# Patient Record
Sex: Female | Born: 1970 | Race: White | Hispanic: No | Marital: Married | Smoking: Current some day smoker
Health system: Southern US, Community
[De-identification: ages and names within clinical notes are randomized; demographics above are authoritative.]

## PROBLEM LIST (undated history)

## (undated) DIAGNOSIS — I10 Essential (primary) hypertension: Secondary | ICD-10-CM

## (undated) DIAGNOSIS — F329 Major depressive disorder, single episode, unspecified: Secondary | ICD-10-CM

## (undated) DIAGNOSIS — R519 Headache, unspecified: Secondary | ICD-10-CM

## (undated) DIAGNOSIS — K219 Gastro-esophageal reflux disease without esophagitis: Secondary | ICD-10-CM

## (undated) DIAGNOSIS — N200 Calculus of kidney: Secondary | ICD-10-CM

## (undated) DIAGNOSIS — J45909 Unspecified asthma, uncomplicated: Secondary | ICD-10-CM

## (undated) DIAGNOSIS — N390 Urinary tract infection, site not specified: Secondary | ICD-10-CM

## (undated) DIAGNOSIS — R51 Headache: Secondary | ICD-10-CM

## (undated) DIAGNOSIS — G43909 Migraine, unspecified, not intractable, without status migrainosus: Secondary | ICD-10-CM

## (undated) DIAGNOSIS — Z87442 Personal history of urinary calculi: Secondary | ICD-10-CM

## (undated) DIAGNOSIS — F32A Depression, unspecified: Secondary | ICD-10-CM

## (undated) DIAGNOSIS — B019 Varicella without complication: Secondary | ICD-10-CM

## (undated) HISTORY — DX: Migraine, unspecified, not intractable, without status migrainosus: G43.909

## (undated) HISTORY — PX: LAPAROSCOPIC ENDOMETRIOSIS FULGURATION: SUR769

## (undated) HISTORY — DX: Depression, unspecified: F32.A

## (undated) HISTORY — DX: Headache, unspecified: R51.9

## (undated) HISTORY — DX: Calculus of kidney: N20.0

## (undated) HISTORY — DX: Major depressive disorder, single episode, unspecified: F32.9

## (undated) HISTORY — DX: Headache: R51

## (undated) HISTORY — PX: TONSILLECTOMY AND ADENOIDECTOMY: SUR1326

## (undated) HISTORY — DX: Varicella without complication: B01.9

## (undated) HISTORY — DX: Urinary tract infection, site not specified: N39.0

## (undated) HISTORY — DX: Essential (primary) hypertension: I10

---

## 2003-09-17 ENCOUNTER — Other Ambulatory Visit: Payer: Self-pay

## 2004-10-04 ENCOUNTER — Ambulatory Visit: Payer: Self-pay | Admitting: Family Medicine

## 2004-10-15 ENCOUNTER — Ambulatory Visit: Payer: Self-pay | Admitting: Unknown Physician Specialty

## 2004-12-03 ENCOUNTER — Ambulatory Visit: Payer: Self-pay | Admitting: Family Medicine

## 2005-03-08 ENCOUNTER — Ambulatory Visit: Payer: Self-pay | Admitting: Obstetrics and Gynecology

## 2005-04-26 ENCOUNTER — Emergency Department: Payer: Self-pay | Admitting: Emergency Medicine

## 2005-05-07 ENCOUNTER — Ambulatory Visit: Payer: Self-pay | Admitting: Gastroenterology

## 2005-05-31 ENCOUNTER — Encounter (INDEPENDENT_AMBULATORY_CARE_PROVIDER_SITE_OTHER): Payer: Self-pay | Admitting: Specialist

## 2005-05-31 ENCOUNTER — Ambulatory Visit (HOSPITAL_BASED_OUTPATIENT_CLINIC_OR_DEPARTMENT_OTHER): Admission: RE | Admit: 2005-05-31 | Discharge: 2005-05-31 | Payer: Self-pay | Admitting: Urology

## 2005-07-21 ENCOUNTER — Emergency Department: Payer: Self-pay | Admitting: Emergency Medicine

## 2007-05-31 ENCOUNTER — Emergency Department: Payer: Self-pay | Admitting: Emergency Medicine

## 2007-11-12 ENCOUNTER — Emergency Department: Payer: Self-pay

## 2008-03-04 LAB — HM COLONOSCOPY: HM Colonoscopy: NORMAL

## 2009-01-05 ENCOUNTER — Emergency Department (HOSPITAL_COMMUNITY): Admission: EM | Admit: 2009-01-05 | Discharge: 2009-01-06 | Payer: Self-pay | Admitting: Emergency Medicine

## 2010-03-04 HISTORY — PX: ABDOMINAL HYSTERECTOMY: SHX81

## 2010-03-25 ENCOUNTER — Encounter: Payer: Self-pay | Admitting: Otolaryngology

## 2010-06-06 LAB — DIFFERENTIAL
Basophils Absolute: 0 10*3/uL (ref 0.0–0.1)
Basophils Relative: 0 % (ref 0–1)
Eosinophils Absolute: 0.1 10*3/uL (ref 0.0–0.7)
Eosinophils Relative: 1 % (ref 0–5)
Lymphocytes Relative: 34 % (ref 12–46)
Lymphs Abs: 5.1 10*3/uL — ABNORMAL HIGH (ref 0.7–4.0)
Monocytes Absolute: 0.8 10*3/uL (ref 0.1–1.0)
Monocytes Relative: 6 % (ref 3–12)
Neutro Abs: 8.7 10*3/uL — ABNORMAL HIGH (ref 1.7–7.7)
Neutrophils Relative %: 59 % (ref 43–77)

## 2010-06-06 LAB — URINALYSIS, ROUTINE W REFLEX MICROSCOPIC
Bilirubin Urine: NEGATIVE
Glucose, UA: NEGATIVE mg/dL
Ketones, ur: NEGATIVE mg/dL
Leukocytes, UA: NEGATIVE
Nitrite: NEGATIVE
Protein, ur: 30 mg/dL — AB
Specific Gravity, Urine: 1.025 (ref 1.005–1.030)
Urobilinogen, UA: 0.2 mg/dL (ref 0.0–1.0)
pH: 6 (ref 5.0–8.0)

## 2010-06-06 LAB — WET PREP, GENITAL
Trich, Wet Prep: NONE SEEN
WBC, Wet Prep HPF POC: NONE SEEN
Yeast Wet Prep HPF POC: NONE SEEN

## 2010-06-06 LAB — CBC
HCT: 42.8 % (ref 36.0–46.0)
Hemoglobin: 14.3 g/dL (ref 12.0–15.0)
MCHC: 33.5 g/dL (ref 30.0–36.0)
MCV: 89.6 fL (ref 78.0–100.0)
Platelets: 383 10*3/uL (ref 150–400)
RBC: 4.77 MIL/uL (ref 3.87–5.11)
RDW: 13.8 % (ref 11.5–15.5)
WBC: 14.8 10*3/uL — ABNORMAL HIGH (ref 4.0–10.5)

## 2010-06-06 LAB — URINE CULTURE

## 2010-06-06 LAB — URINE MICROSCOPIC-ADD ON

## 2010-06-06 LAB — POCT PREGNANCY, URINE: Preg Test, Ur: NEGATIVE

## 2010-06-06 LAB — POCT I-STAT, CHEM 8
BUN: 16 mg/dL (ref 6–23)
Calcium, Ion: 1.16 mmol/L (ref 1.12–1.32)
Chloride: 102 mEq/L (ref 96–112)
Creatinine, Ser: 0.8 mg/dL (ref 0.4–1.2)
Glucose, Bld: 75 mg/dL (ref 70–99)
HCT: 46 % (ref 36.0–46.0)
Hemoglobin: 15.6 g/dL — ABNORMAL HIGH (ref 12.0–15.0)
Potassium: 3.2 mEq/L — ABNORMAL LOW (ref 3.5–5.1)
Sodium: 139 mEq/L (ref 135–145)
TCO2: 26 mmol/L (ref 0–100)

## 2010-06-06 LAB — GC/CHLAMYDIA PROBE AMP, GENITAL
Chlamydia, DNA Probe: NEGATIVE
GC Probe Amp, Genital: NEGATIVE

## 2010-07-20 NOTE — Op Note (Signed)
Crystal Leonard, Crystal Leonard              ACCOUNT NO.:  [id number]   MEDICAL RECORD NO.:  [id number]          PATIENT TYPE:  AMB   LOCATION:  [facility]                         FACILITY:  [facility]   PHYSICIAN:  Ronald L. Earlene Plater, M.D.  DATE OF BIRTH:  31-Oct-1970   DATE OF PROCEDURE:  05/31/2005  DATE OF DISCHARGE:  05/31/2005                                 OPERATIVE REPORT   DIAGNOSIS:  Pelvic pain, questionable interstitial cystitis.   OPERATIVE PROCEDURE:  Cystourethroscopy, hydraulic bladder distention,  bladder biopsy.   SURGEON:  Lucrezia Starch. Earlene Plater, M.D.   ANESTHESIA:  LMA.   ESTIMATED BLOOD LOSS:  Negligible.   TUBES:  None.   COMPLICATIONS:  None.   INDICATIONS FOR PROCEDURE:  [name] is a lovely 40 year old white female  who essentially presents with chronic pelvic pain since October 2006.  She  notes that she has had severe endometriosis and noted that she underwent  laparoscopic evaluation in January 2007 due to the pelvic pain that began in  October 2006.  Endometrioma was revealed.  She was put on Depo-Provera and  subsequently put on Lupron, but she has continued to have pain on a 5 to  6/10 scale.  It can be as severe as 10.  She has been to the emergency room  several times in the last few months.  She has had multiple urine tests and  essentially comes in for evaluation.  She notes that the Pyridium has  improved her symptoms some, but she still has dysuria, frequency, some  hesitancy and basically pelvic pain.  After understanding the risks,  benefits and alternatives, she would like to proceed with the above  procedure.   PROCEDURE IN DETAIL:  The patient was placed in the supine position.  After  proper LMA anesthesia, she was placed in the dorsal lithotomy position,  prepped and draped with Betadine in sterile fashion.  Cystourethroscopy was  performed with a 22.5 French Olympus panendoscope, and the bladder was  carefully distended to 80 cm of water pressure.  She was  noted to have a  bladder capacity of only 450 cc, and when it was removed there were a few  but diffuse tiny glomerulations.  No cracks were noted and the  glomerulations were not terribly severe.  Biopsy was taken of the posterior  midline bladder and submitted to pathology.  The base was cauterized with  Bovie coagulation cautery.  The bladder was drained.  The panendoscope was  removed, and the patient was taken to the recovery room stable.     Ronald L. Earlene Plater, M.D.  Electronically Signed    RLD/MEDQ  D:  05/31/2005  T:  06/03/2005  Job:  [id number]

## 2010-09-14 ENCOUNTER — Emergency Department: Payer: Self-pay | Admitting: Emergency Medicine

## 2011-03-05 LAB — HM PAP SMEAR: HM Pap smear: NORMAL

## 2013-05-05 LAB — BASIC METABOLIC PANEL: BUN: 15 mg/dL (ref 4–21)

## 2013-05-27 LAB — HEPATIC FUNCTION PANEL
ALT: 14 U/L (ref 7–35)
AST: 14 U/L (ref 13–35)

## 2013-05-27 LAB — BASIC METABOLIC PANEL
Creatinine: 1 mg/dL (ref <=1.1)
Glucose: 92 mg/dL
Potassium: 4.4 mmol/L (ref 3.4–5.3)
Sodium: 141 mmol/L (ref 137–147)

## 2013-05-27 LAB — TSH: TSH: 1.84 u[IU]/mL (ref <=5.90)

## 2013-06-07 ENCOUNTER — Ambulatory Visit: Payer: Self-pay | Admitting: Adult Health

## 2014-05-03 ENCOUNTER — Encounter (INDEPENDENT_AMBULATORY_CARE_PROVIDER_SITE_OTHER): Payer: Self-pay

## 2014-05-03 ENCOUNTER — Ambulatory Visit (INDEPENDENT_AMBULATORY_CARE_PROVIDER_SITE_OTHER): Payer: No Typology Code available for payment source | Admitting: Nurse Practitioner

## 2014-05-03 ENCOUNTER — Encounter: Payer: Self-pay | Admitting: Nurse Practitioner

## 2014-05-03 VITALS — BP 118/76 | HR 73 | Temp 98.0°F | Resp 12 | Ht 65.0 in | Wt 258.1 lb

## 2014-05-03 DIAGNOSIS — F329 Major depressive disorder, single episode, unspecified: Secondary | ICD-10-CM | POA: Diagnosis not present

## 2014-05-03 DIAGNOSIS — F32A Depression, unspecified: Secondary | ICD-10-CM

## 2014-05-03 DIAGNOSIS — G43809 Other migraine, not intractable, without status migrainosus: Secondary | ICD-10-CM

## 2014-05-03 DIAGNOSIS — I10 Essential (primary) hypertension: Secondary | ICD-10-CM

## 2014-05-03 DIAGNOSIS — Z7689 Persons encountering health services in other specified circumstances: Secondary | ICD-10-CM

## 2014-05-03 DIAGNOSIS — F17200 Nicotine dependence, unspecified, uncomplicated: Secondary | ICD-10-CM

## 2014-05-03 DIAGNOSIS — Z720 Tobacco use: Secondary | ICD-10-CM

## 2014-05-03 MED ORDER — HYDROCHLOROTHIAZIDE 12.5 MG PO TABS: 12.5000 mg | ORAL_TABLET | Freq: Every day | ORAL | Status: DC

## 2014-05-03 NOTE — Progress Notes (Signed)
Subjective:    Patient ID: Crystal Leonard, female    DOB: 1970-12-11, 44 y.o.   MRN: [id number]  HPI  Crystal Leonard is a 44 yo female establishing care.   1) New Pt info:    Diet- Eats at home, orders take out   Exercise- Walking dog 1 x a week   Immunizations-   Mammogram- Wants referral   Pap- 2013, Partial Hysterectomy (ovaries intact)   Colonoscopy- When she found out endometriosis had colonoscopy to r/out other issues- negative findings   Eye Exam- Years ago   Dental Exam- Up to date   2) Chronic Problems-  Depression- Crystal Dus, NP. Started seeing her within last year. Sees her q 3 mos  Migraines- controlled currently   Chronic UTI's- none recently   Tobacco Use- Not ready to quit- okay with receiving information   3) Acute Problems-  HTN- Checks at home. Usually has HTN when going to the Doctor. Took about 10 pills of HCTZ and thought it broke out her face. Day before yesterday last dose. BP's at home running around- 130/85    Review of Systems  Constitutional: Negative for fever, chills, diaphoresis, fatigue and unexpected weight change.  HENT: Negative for tinnitus and trouble swallowing.   Eyes: Negative for visual disturbance.  Respiratory: Negative for chest tightness, shortness of breath and wheezing.   Cardiovascular: Negative for chest pain, palpitations and leg swelling.  Gastrointestinal: Negative for nausea, vomiting, abdominal pain, diarrhea, constipation and blood in stool.  Endocrine: Negative for polydipsia, polyphagia and polyuria.  Genitourinary: Negative for dysuria, hematuria, vaginal discharge and vaginal pain.  Musculoskeletal: Negative for myalgias, back pain, arthralgias and gait problem.  Skin: Negative for color change and rash.  Neurological: Negative for dizziness, weakness, numbness and headaches.  Hematological: Does not bruise/bleed easily.  Psychiatric/Behavioral: Negative for suicidal ideas and sleep disturbance. The patient is  nervous/anxious.        Depression   Past Medical History  Diagnosis Date  . Depression   . Chicken pox   . Headache     Frequent headaches  . Hypertension   . Kidney stones   . Migraine   . UTI (lower urinary tract infection)     History   Social History  . Marital Status: Single    Spouse Name: N/A  . Number of Children: N/A  . Years of Education: N/A   Occupational History  . Not on file.   Social History Main Topics  . Smoking status: Current Every Day Smoker -- 1.00 packs/day  . Smokeless tobacco: Never Used  . Alcohol Use: 0.0 oz/week    0 Standard drinks or equivalent per week     Comment: Rare occasions   . Drug Use: No  . Sexual Activity:    Partners: Male     Comment: Husband    Other Topics Concern  . Not on file   Social History Narrative   Lives with husband    Pets 4 dogs- Inside/outside dogs    No children   Jobs is a Herbalist and pet sitting    Enjoys hanging with friends and traveling     Past Surgical History  Procedure Laterality Date  . Laparoscopic endometriosis fulguration    . Abdominal hysterectomy  2012  . Tonsillectomy and adenoidectomy      Family History  Problem Relation Age of Onset  . Hypertension Mother   . Cancer Father     prostate  . Diabetes Maternal  Aunt   . Hypertension Maternal Grandmother     Allergies  Allergen Reactions  . Tetanus Toxoids Nausea And Vomiting    No current outpatient prescriptions on file prior to visit.   No current facility-administered medications on file prior to visit.      Objective:   Physical Exam  Constitutional: She is oriented to person, place, and time. She appears well-developed and well-nourished. No distress.  BP 118/76 mmHg  Pulse 73  Temp(Src) 98 F (36.7 C) (Oral)  Resp 12  Ht 5\' 5"  (1.651 m)  Wt 258 lb 1.9 oz (117.082 kg)  BMI 42.95 kg/m2  SpO2 97%   HENT:  Head: Normocephalic and atraumatic.  Right Ear: External ear normal.  Left Ear: External  ear normal.  Mouth/Throat: No oropharyngeal exudate.  TM's clear bilaterally  Eyes: EOM are normal. Pupils are equal, round, and reactive to light. Right eye exhibits no discharge. Left eye exhibits no discharge. No scleral icterus.  Neck: Normal range of motion. Neck supple. No thyromegaly present.  Cardiovascular: Normal rate, regular rhythm, normal heart sounds and intact distal pulses.  Exam reveals no gallop and no friction rub.   No murmur heard. Pulmonary/Chest: Effort normal and breath sounds normal. No respiratory distress. She has no wheezes. She has no rales. She exhibits no tenderness.  Abdominal: Soft. Bowel sounds are normal. She exhibits no distension and no mass. There is no tenderness. There is no rebound and no guarding.  Musculoskeletal: Normal range of motion. She exhibits no edema or tenderness.  Lymphadenopathy:    She has no cervical adenopathy.  Neurological: She is alert and oriented to person, place, and time. No cranial nerve deficit. She exhibits normal muscle tone. Coordination normal.  Skin: Skin is warm and dry. No rash noted. She is not diaphoretic.  Psychiatric: She has a normal mood and affect. Her behavior is normal. Judgment and thought content normal.          Assessment & Plan:

## 2014-05-03 NOTE — Patient Instructions (Signed)
Please make your physical with fasting labs before leaving today at the front desk.  Welcome (back) to [facility]!   Smoking Cessation Quitting smoking is important to your health and has many advantages. However, it is not always easy to quit since nicotine is a very addictive drug. Oftentimes, people try 3 times or more before being able to quit. This document explains the best ways for you to prepare to quit smoking. Quitting takes hard work and a lot of effort, but you can do it. ADVANTAGES OF QUITTING SMOKING  You will live longer, feel better, and live better.  Your body will feel the impact of quitting smoking almost immediately.  Within 20 minutes, blood pressure decreases. Your pulse returns to its normal level.  After 8 hours, carbon monoxide levels in the blood return to normal. Your oxygen level increases.  After 24 hours, the chance of having a heart attack starts to decrease. Your breath, hair, and body stop smelling like smoke.  After 48 hours, damaged nerve endings begin to recover. Your sense of taste and smell improve.  After 72 hours, the body is virtually free of nicotine. Your bronchial tubes relax and breathing becomes easier.  After 2 to 12 weeks, lungs can hold more air. Exercise becomes easier and circulation improves.  The risk of having a heart attack, stroke, cancer, or lung disease is greatly reduced.  After 1 year, the risk of coronary heart disease is cut in half.  After 5 years, the risk of stroke falls to the same as a nonsmoker.  After 10 years, the risk of lung cancer is cut in half and the risk of other cancers decreases significantly.  After 15 years, the risk of coronary heart disease drops, usually to the level of a nonsmoker.  If you are pregnant, quitting smoking will improve your chances of having a healthy baby.  The people you live with, especially any children, will be healthier.  You will have extra money to spend on things other than  cigarettes. QUESTIONS TO THINK ABOUT BEFORE ATTEMPTING TO QUIT You may want to talk about your answers with your health care provider.  Why do you want to quit?  If you tried to quit in the past, what helped and what did not?  What will be the most difficult situations for you after you quit? How will you plan to handle them?  Who can help you through the tough times? Your family? Friends? A health care provider?  What pleasures do you get from smoking? What ways can you still get pleasure if you quit? Here are some questions to ask your health care provider:  How can you help me to be successful at quitting?  What medicine do you think would be best for me and how should I take it?  What should I do if I need more help?  What is smoking withdrawal like? How can I get information on withdrawal? GET READY  Set a quit date.  Change your environment by getting rid of all cigarettes, ashtrays, matches, and lighters in your home, car, or work. Do not let people smoke in your home.  Review your past attempts to quit. Think about what worked and what did not. GET SUPPORT AND ENCOURAGEMENT You have a better chance of being successful if you have help. You can get support in many ways.  Tell your family, friends, and coworkers that you are going to quit and need their support. Ask them not to smoke  around you.  Get individual, group, or telephone counseling and support. Programs are available at [employer] and health centers. Call your local health department for information about programs in your area.  Spiritual beliefs and practices may help some smokers quit.  Download a "quit meter" on your computer to keep track of quit statistics, such as how long you have gone without smoking, cigarettes not smoked, and money saved.  Get a self-help book about quitting smoking and staying off tobacco. Sims yourself from urges to smoke. Talk to  someone, go for a walk, or occupy your time with a task.  Change your normal routine. Take a different route to work. Drink tea instead of coffee. Eat breakfast in a different place.  Reduce your stress. Take a hot bath, exercise, or read a book.  Plan something enjoyable to do every day. Reward yourself for not smoking.  Explore interactive web-based programs that specialize in helping you quit. GET MEDICINE AND USE IT CORRECTLY Medicines can help you stop smoking and decrease the urge to smoke. Combining medicine with the above behavioral methods and support can greatly increase your chances of successfully quitting smoking.  Nicotine replacement therapy helps deliver nicotine to your body without the negative effects and risks of smoking. Nicotine replacement therapy includes nicotine gum, lozenges, inhalers, nasal sprays, and skin patches. Some may be available over-the-counter and others require a prescription.  Antidepressant medicine helps people abstain from smoking, but how this works is unknown. This medicine is available by prescription.  Nicotinic receptor partial agonist medicine simulates the effect of nicotine in your brain. This medicine is available by prescription. Ask your health care provider for advice about which medicines to use and how to use them based on your health history. Your health care provider will tell you what side effects to look out for if you choose to be on a medicine or therapy. Carefully read the information on the package. Do not use any other product containing nicotine while using a nicotine replacement product.  RELAPSE OR DIFFICULT SITUATIONS Most relapses occur within the first 3 months after quitting. Do not be discouraged if you start smoking again. Remember, most people try several times before finally quitting. You may have symptoms of withdrawal because your body is used to nicotine. You may crave cigarettes, be irritable, feel very hungry, cough  often, get headaches, or have difficulty concentrating. The withdrawal symptoms are only temporary. They are strongest when you first quit, but they will go away within 10-14 days. To reduce the chances of relapse, try to:  Avoid drinking alcohol. Drinking lowers your chances of successfully quitting.  Reduce the amount of caffeine you consume. Once you quit smoking, the amount of caffeine in your body increases and can give you symptoms, such as a rapid heartbeat, sweating, and anxiety.  Avoid smokers because they can make you want to smoke.  Do not let weight gain distract you. Many smokers will gain weight when they quit, usually less than 10 pounds. Eat a healthy diet and stay active. You can always lose the weight gained after you quit.  Find ways to improve your mood other than smoking. FOR MORE INFORMATION  www.smokefree.gov  Document Released: 02/12/2001 Document Revised: 07/05/2013 Document Reviewed: 05/30/2011 [facility] Patient Information 2015 [place], [place]. This information is not intended to replace advice given to you by your health care provider. Make sure you discuss any questions you have with your health care provider.

## 2014-05-03 NOTE — Progress Notes (Signed)
Pre visit review using our clinic review tool, if applicable. No additional management support is needed unless otherwise documented below in the visit note. 

## 2014-05-04 ENCOUNTER — Telehealth: Payer: Self-pay | Admitting: Nurse Practitioner

## 2014-05-04 NOTE — Telephone Encounter (Signed)
emmi emailed °

## 2014-05-07 DIAGNOSIS — F17200 Nicotine dependence, unspecified, uncomplicated: Secondary | ICD-10-CM | POA: Insufficient documentation

## 2014-05-07 DIAGNOSIS — F329 Major depressive disorder, single episode, unspecified: Secondary | ICD-10-CM | POA: Insufficient documentation

## 2014-05-07 DIAGNOSIS — Z7689 Persons encountering health services in other specified circumstances: Secondary | ICD-10-CM | POA: Insufficient documentation

## 2014-05-07 DIAGNOSIS — G43909 Migraine, unspecified, not intractable, without status migrainosus: Secondary | ICD-10-CM | POA: Insufficient documentation

## 2014-05-07 DIAGNOSIS — F32A Depression, unspecified: Secondary | ICD-10-CM | POA: Insufficient documentation

## 2014-05-07 DIAGNOSIS — I10 Essential (primary) hypertension: Secondary | ICD-10-CM | POA: Insufficient documentation

## 2014-05-07 NOTE — Assessment & Plan Note (Signed)
Controlled with HCTZ 12.5 mg. Pt thinks something else was breaking out her face at the time and wants to continue with this medication. She was on 25 mg and I ordered 12.5 to see if this is helpful.

## 2014-05-07 NOTE — Assessment & Plan Note (Signed)
Controlled currently will follow

## 2014-05-07 NOTE — Assessment & Plan Note (Addendum)
Discussed acute and chronic issues. Reviewed health maintenance measures, PFSHx, and immunizations. Obtain records from previous facility. Asked pt to make an appointment for a physical with fasting labs.

## 2014-05-07 NOTE — Assessment & Plan Note (Signed)
Pt is currently not ready to quit. She thinks about it and is okay with receiving info today. I gave her the handout on her AVS with the questions to think about before attempting to quite highlighted. Will follow.

## 2014-05-07 NOTE — Assessment & Plan Note (Signed)
Sees a provider for depression every 3 months. Controlled currently

## 2014-05-28 LAB — HEPATIC FUNCTION PANEL
ALT: 14 U/L (ref 7–35)
AST: 14 U/L (ref 13–35)
Alkaline Phosphatase: 92 U/L (ref 25–125)
Bilirubin, Total: 0.4 mg/dL

## 2014-05-28 LAB — TSH: TSH: 1.84 u[IU]/mL (ref 0.41–5.90)

## 2014-05-28 LAB — BASIC METABOLIC PANEL
BUN: 15 mg/dL (ref 4–21)
Creatinine: 1 mg/dL (ref 0.5–1.1)
Glucose: 92 mg/dL
Potassium: 4.4 mmol/L (ref 3.4–5.3)
Sodium: 141 mmol/L (ref 137–147)

## 2014-06-03 ENCOUNTER — Encounter: Payer: No Typology Code available for payment source | Admitting: Nurse Practitioner

## 2014-06-16 ENCOUNTER — Encounter: Payer: Self-pay | Admitting: Nurse Practitioner

## 2014-06-29 ENCOUNTER — Other Ambulatory Visit: Payer: Self-pay | Admitting: Nurse Practitioner

## 2014-07-06 ENCOUNTER — Ambulatory Visit (INDEPENDENT_AMBULATORY_CARE_PROVIDER_SITE_OTHER): Payer: BC Managed Care – PPO | Admitting: Nurse Practitioner

## 2014-07-06 ENCOUNTER — Encounter: Payer: Self-pay | Admitting: Nurse Practitioner

## 2014-07-06 VITALS — BP 121/77 | HR 99 | Temp 98.0°F | Ht 65.0 in | Wt 253.2 lb

## 2014-07-06 DIAGNOSIS — R202 Paresthesia of skin: Secondary | ICD-10-CM | POA: Diagnosis not present

## 2014-07-06 DIAGNOSIS — I10 Essential (primary) hypertension: Secondary | ICD-10-CM

## 2014-07-06 DIAGNOSIS — Z1239 Encounter for other screening for malignant neoplasm of breast: Secondary | ICD-10-CM

## 2014-07-06 DIAGNOSIS — Z0000 Encounter for general adult medical examination without abnormal findings: Secondary | ICD-10-CM | POA: Insufficient documentation

## 2014-07-06 DIAGNOSIS — Z720 Tobacco use: Secondary | ICD-10-CM | POA: Diagnosis not present

## 2014-07-06 DIAGNOSIS — R42 Dizziness and giddiness: Secondary | ICD-10-CM | POA: Diagnosis not present

## 2014-07-06 DIAGNOSIS — F17200 Nicotine dependence, unspecified, uncomplicated: Secondary | ICD-10-CM

## 2014-07-06 DIAGNOSIS — F32A Depression, unspecified: Secondary | ICD-10-CM

## 2014-07-06 DIAGNOSIS — G43809 Other migraine, not intractable, without status migrainosus: Secondary | ICD-10-CM

## 2014-07-06 DIAGNOSIS — F329 Major depressive disorder, single episode, unspecified: Secondary | ICD-10-CM

## 2014-07-06 LAB — LIPID PANEL
Cholesterol: 185 mg/dL (ref 0–200)
HDL: 34.7 mg/dL — ABNORMAL LOW (ref >39.00)
LDL Cholesterol: 112 mg/dL — ABNORMAL HIGH (ref 0–99)
NonHDL: 150.3
Total CHOL/HDL Ratio: 5
Triglycerides: 192 mg/dL — ABNORMAL HIGH (ref 0.0–149.0)
VLDL: 38.4 mg/dL (ref 0.0–40.0)

## 2014-07-06 LAB — CBC WITH DIFFERENTIAL/PLATELET
Basophils Absolute: 0 10*3/uL (ref 0.0–0.1)
Basophils Relative: 0.4 % (ref 0.0–3.0)
Eosinophils Absolute: 0.2 10*3/uL (ref 0.0–0.7)
Eosinophils Relative: 1.9 % (ref 0.0–5.0)
HCT: 43.5 % (ref 36.0–46.0)
Hemoglobin: 14.9 g/dL (ref 12.0–15.0)
Lymphocytes Relative: 33.5 % (ref 12.0–46.0)
Lymphs Abs: 3.3 10*3/uL (ref 0.7–4.0)
MCHC: 34.3 g/dL (ref 30.0–36.0)
MCV: 86.8 fl (ref 78.0–100.0)
Monocytes Absolute: 0.5 10*3/uL (ref 0.1–1.0)
Monocytes Relative: 5 % (ref 3.0–12.0)
Neutro Abs: 5.7 10*3/uL (ref 1.4–7.7)
Neutrophils Relative %: 59.2 % (ref 43.0–77.0)
Platelets: 442 10*3/uL — ABNORMAL HIGH (ref 150.0–400.0)
RBC: 5.01 Mil/uL (ref 3.87–5.11)
RDW: 13.9 % (ref 11.5–15.5)
WBC: 9.7 10*3/uL (ref 4.0–10.5)

## 2014-07-06 LAB — COMPREHENSIVE METABOLIC PANEL
ALT: 18 U/L (ref 0–35)
AST: 17 U/L (ref 0–37)
Albumin: 4.1 g/dL (ref 3.5–5.2)
Alkaline Phosphatase: 72 U/L (ref 39–117)
BUN: 18 mg/dL (ref 6–23)
CO2: 28 mEq/L (ref 19–32)
Calcium: 10.5 mg/dL (ref 8.4–10.5)
Chloride: 102 mEq/L (ref 96–112)
Creatinine, Ser: 0.89 mg/dL (ref 0.40–1.20)
GFR: 73.78 mL/min (ref >60.00)
Glucose, Bld: 107 mg/dL — ABNORMAL HIGH (ref 70–99)
Potassium: 4.1 mEq/L (ref 3.5–5.1)
Sodium: 137 mEq/L (ref 135–145)
Total Bilirubin: 0.4 mg/dL (ref 0.2–1.2)
Total Protein: 7.9 g/dL (ref 6.0–8.3)

## 2014-07-06 LAB — HEMOGLOBIN A1C: Hgb A1c MFr Bld: 6.3 % (ref 4.6–6.5)

## 2014-07-06 LAB — VITAMIN B12: Vitamin B-12: 626 pg/mL (ref 211–911)

## 2014-07-06 NOTE — Assessment & Plan Note (Signed)
No concerns, stable

## 2014-07-06 NOTE — Assessment & Plan Note (Addendum)
Discussed acute and chronic issues. Reviewed health maintenance measures, PFSHx, and immunizations. Obtain routine labs TSH, Lipid panel, CBC w/ diff, A1c, and CMET. B12 for tingling  Mammogram ordered   No PAP- hysterectomy, did not want pelvic or breast exam will get mammogram

## 2014-07-06 NOTE — Assessment & Plan Note (Signed)
Will check B12, pt started PO B12 vitamin last week

## 2014-07-06 NOTE — Assessment & Plan Note (Signed)
Will start patches soon. Wants to cut down, gave smoking cessation paperwork at last visit.

## 2014-07-06 NOTE — Patient Instructions (Addendum)
Health Maintenance Adopting a healthy lifestyle and getting preventive care can go a long way to promote health and wellness. Talk with your health care provider about what schedule of regular examinations is right for you. This is a good chance for you to check in with your provider about disease prevention and staying healthy. In between checkups, there are plenty of things you can do on your own. Experts have done a lot of research about which lifestyle changes and preventive measures are most likely to keep you healthy. Ask your health care provider for more information. WEIGHT AND DIET  Eat a healthy diet  Be sure to include plenty of vegetables, fruits, low-fat dairy products, and lean protein.  Do not eat a lot of foods high in solid fats, added sugars, or salt.  Get regular exercise. This is one of the most important things you can do for your health.  Most adults should exercise for at least 150 minutes each week. The exercise should increase your heart rate and make you sweat (moderate-intensity exercise).  Most adults should also do strengthening exercises at least twice a week. This is in addition to the moderate-intensity exercise.  Maintain a healthy weight  Body mass index (BMI) is a measurement that can be used to identify possible weight problems. It estimates body fat based on height and weight. Your health care provider can help determine your BMI and help you achieve or maintain a healthy weight.  For females 25 years of age and older:   A BMI below 18.5 is considered underweight.  A BMI of 18.5 to 24.9 is normal.  A BMI of 25 to 29.9 is considered overweight.  A BMI of 30 and above is considered obese.  Watch levels of cholesterol and blood lipids  You should start having your blood tested for lipids and cholesterol at 43 years of age, then have this test every 5 years.  You may need to have your cholesterol levels checked more often if:  Your lipid or  cholesterol levels are high.  You are older than 43 years of age.  You are at high risk for heart disease.  CANCER SCREENING   Lung Cancer  Lung cancer screening is recommended for adults 97-92 years old who are at high risk for lung cancer because of a history of smoking.  A yearly low-dose CT scan of the lungs is recommended for people who:  Currently smoke.  Have quit within the past 15 years.  Have at least a 30-pack-year history of smoking. A pack year is smoking an average of one pack of cigarettes a day for 1 year.  Yearly screening should continue until it has been 15 years since you quit.  Yearly screening should stop if you develop a health problem that would prevent you from having lung cancer treatment.  Breast Cancer  Practice breast self-awareness. This means understanding how your breasts normally appear and feel.  It also means doing regular breast self-exams. Let your health care provider know about any changes, no matter how small.  If you are in your 20s or 30s, you should have a clinical breast exam (CBE) by a health care provider every 1-3 years as part of a regular health exam.  If you are 76 or older, have a CBE every year. Also consider having a breast X-ray (mammogram) every year.  If you have a family history of breast cancer, talk to your health care provider about genetic screening.  If you are  at high risk for breast cancer, talk to your health care provider about having an MRI and a mammogram every year.  Breast cancer gene (BRCA) assessment is recommended for women who have family members with BRCA-related cancers. BRCA-related cancers include:  Breast.  Ovarian.  Tubal.  Peritoneal cancers.  Results of the assessment will determine the need for genetic counseling and BRCA1 and BRCA2 testing. Cervical Cancer Routine pelvic examinations to screen for cervical cancer are no longer recommended for nonpregnant women who are considered low  risk for cancer of the pelvic organs (ovaries, uterus, and vagina) and who do not have symptoms. A pelvic examination may be necessary if you have symptoms including those associated with pelvic infections. Ask your health care provider if a screening pelvic exam is right for you.   The Pap test is the screening test for cervical cancer for women who are considered at risk.  If you had a hysterectomy for a problem that was not cancer or a condition that could lead to cancer, then you no longer need Pap tests.  If you are older than 65 years, and you have had normal Pap tests for the past 10 years, you no longer need to have Pap tests.  If you have had past treatment for cervical cancer or a condition that could lead to cancer, you need Pap tests and screening for cancer for at least 20 years after your treatment.  If you no longer get a Pap test, assess your risk factors if they change (such as having a new sexual partner). This can affect whether you should start being screened again.  Some women have medical problems that increase their chance of getting cervical cancer. If this is the case for you, your health care provider may recommend more frequent screening and Pap tests.  The human papillomavirus (HPV) test is another test that may be used for cervical cancer screening. The HPV test looks for the virus that can cause cell changes in the cervix. The cells collected during the Pap test can be tested for HPV.  The HPV test can be used to screen women 30 years of age and older. Getting tested for HPV can extend the interval between normal Pap tests from three to five years.  An HPV test also should be used to screen women of any age who have unclear Pap test results.  After 43 years of age, women should have HPV testing as often as Pap tests.  Colorectal Cancer  This type of cancer can be detected and often prevented.  Routine colorectal cancer screening usually begins at 43 years of  age and continues through 43 years of age.  Your health care provider may recommend screening at an earlier age if you have risk factors for colon cancer.  Your health care provider may also recommend using home test kits to check for hidden blood in the stool.  A small camera at the end of a tube can be used to examine your colon directly (sigmoidoscopy or colonoscopy). This is done to check for the earliest forms of colorectal cancer.  Routine screening usually begins at age 50.  Direct examination of the colon should be repeated every 5-10 years through 43 years of age. However, you may need to be screened more often if early forms of precancerous polyps or small growths are found. Skin Cancer  Check your skin from head to toe regularly.  Tell your health care provider about any new moles or changes in   moles, especially if there is a change in a mole's shape or color.  Also tell your health care provider if you have a mole that is larger than the size of a pencil eraser.  Always use sunscreen. Apply sunscreen liberally and repeatedly throughout the day.  Protect yourself by wearing long sleeves, pants, a wide-brimmed hat, and sunglasses whenever you are outside. HEART DISEASE, DIABETES, AND HIGH BLOOD PRESSURE   Have your blood pressure checked at least every 1-2 years. High blood pressure causes heart disease and increases the risk of stroke.  If you are between 75 years and 42 years old, ask your health care provider if you should take aspirin to prevent strokes.  Have regular diabetes screenings. This involves taking a blood sample to check your fasting blood sugar level.  If you are at a normal weight and have a low risk for diabetes, have this test once every three years after 43 years of age.  If you are overweight and have a high risk for diabetes, consider being tested at a younger age or more often. PREVENTING INFECTION  Hepatitis B  If you have a higher risk for  hepatitis B, you should be screened for this virus. You are considered at high risk for hepatitis B if:  You were born in a country where hepatitis B is common. Ask your health care provider which countries are considered high risk.  Your parents were born in a high-risk country, and you have not been immunized against hepatitis B (hepatitis B vaccine).  You have HIV or AIDS.  You use needles to inject street drugs.  You live with someone who has hepatitis B.  You have had sex with someone who has hepatitis B.  You get hemodialysis treatment.  You take certain medicines for conditions, including cancer, organ transplantation, and autoimmune conditions. Hepatitis C  Blood testing is recommended for:  Everyone born from [birth year] through 1964.  Anyone with known risk factors for hepatitis C. Sexually transmitted infections (STIs)  You should be screened for sexually transmitted infections (STIs) including gonorrhea and chlamydia if:  You are sexually active and are younger than 43 years of age.  You are older than 43 years of age and your health care provider tells you that you are at risk for this type of infection.  Your sexual activity has changed since you were last screened and you are at an increased risk for chlamydia or gonorrhea. Ask your health care provider if you are at risk.  If you do not have HIV, but are at risk, it may be recommended that you take a prescription medicine daily to prevent HIV infection. This is called pre-exposure prophylaxis (PrEP). You are considered at risk if:  You are sexually active and do not regularly use condoms or know the HIV status of your partner(s).  You take drugs by injection.  You are sexually active with a partner who has HIV. Talk with your health care provider about whether you are at high risk of being infected with HIV. If you choose to begin PrEP, you should first be tested for HIV. You should then be tested every 3 months for  as long as you are taking PrEP.  PREGNANCY   If you are premenopausal and you may become pregnant, ask your health care provider about preconception counseling.  If you may become pregnant, take 400 to 800 micrograms (mcg) of folic acid every day.  If you want to prevent pregnancy, talk to your  health care provider about birth control (contraception). OSTEOPOROSIS AND MENOPAUSE   Osteoporosis is a disease in which the bones lose minerals and strength with aging. This can result in serious bone fractures. Your risk for osteoporosis can be identified using a bone density scan.  If you are 45 years of age or older, or if you are at risk for osteoporosis and fractures, ask your health care provider if you should be screened.  Ask your health care provider whether you should take a calcium or vitamin D supplement to lower your risk for osteoporosis.  Menopause may have certain physical symptoms and risks.  Hormone replacement therapy may reduce some of these symptoms and risks. Talk to your health care provider about whether hormone replacement therapy is right for you.  HOME CARE INSTRUCTIONS   Schedule regular health, dental, and eye exams.  Stay current with your immunizations.   Do not use any tobacco products including cigarettes, chewing tobacco, or electronic cigarettes.  If you are pregnant, do not drink alcohol.  If you are breastfeeding, limit how much and how often you drink alcohol.  Limit alcohol intake to no more than 1 drink per day for nonpregnant women. One drink equals 12 ounces of beer, 5 ounces of wine, or 1 ounces of hard liquor.  Do not use street drugs.  Do not share needles.  Ask your health care provider for help if you need support or information about quitting drugs.  Tell your health care provider if you often feel depressed.  Tell your health care provider if you have ever been abused or do not feel safe at home. Document Released: 09/03/2010  Document Revised: 07/05/2013 Document Reviewed: 01/20/2013 [facility] Patient Information 2015 [place], [place]. This information is not intended to replace advice given to you by your health care provider. Make sure you discuss any questions you have with your health care provider.   Dizziness Dizziness is a common problem. It is a feeling of unsteadiness or light-headedness. You may feel like you are about to faint. Dizziness can lead to injury if you stumble or fall. A person of any age group can suffer from dizziness, but dizziness is more common in older adults. CAUSES  Dizziness can be caused by many different things, including:  Middle ear problems.  Standing for too long.  Infections.  An allergic reaction.  Aging.  An emotional response to something, such as the sight of blood.  Side effects of medicines.  Tiredness.  Problems with circulation or blood pressure.  Excessive use of alcohol or medicines, or illegal drug use.  Breathing too fast (hyperventilation).  An irregular heart rhythm (arrhythmia).  A low red blood cell count (anemia).  Pregnancy.  Vomiting, diarrhea, fever, or other illnesses that cause body fluid loss (dehydration).  Diseases or conditions such as Parkinson's disease, high blood pressure (hypertension), diabetes, and thyroid problems.  Exposure to extreme heat. DIAGNOSIS  Your health care provider will ask about your symptoms, perform a physical exam, and perform an electrocardiogram (ECG) to record the electrical activity of your heart. Your health care provider may also perform other heart or blood tests to determine the cause of your dizziness. These may include:  Transthoracic echocardiogram (TTE). During echocardiography, sound waves are used to evaluate how blood flows through your heart.  Transesophageal echocardiogram (TEE).  Cardiac monitoring. This allows your health care provider to monitor your heart rate and rhythm in real  time.  Holter monitor. This is a portable device that records  your heartbeat and can help diagnose heart arrhythmias. It allows your health care provider to track your heart activity for several days if needed.  Stress tests by exercise or by giving medicine that makes the heart beat faster. TREATMENT  Treatment of dizziness depends on the cause of your symptoms and can vary greatly. HOME CARE INSTRUCTIONS   Drink enough fluids to keep your urine clear or pale yellow. This is especially important in very hot weather. In older adults, it is also important in cold weather.  Take your medicine exactly as directed if your dizziness is caused by medicines. When taking blood pressure medicines, it is especially important to get up slowly.  Rise slowly from chairs and steady yourself until you feel okay.  In the morning, first sit up on the side of the bed. When you feel okay, stand slowly while holding onto something until you know your balance is fine.  Move your legs often if you need to stand in one place for a long time. Tighten and relax your muscles in your legs while standing.  Have someone stay with you for 1-2 days if dizziness continues to be a problem. Do this until you feel you are well enough to stay alone. Have the person call your health care provider if he or she notices changes in you that are concerning.  Do not drive or use heavy machinery if you feel dizzy.  Do not drink alcohol. SEEK IMMEDIATE MEDICAL CARE IF:   Your dizziness or light-headedness gets worse.  You feel nauseous or vomit.  You have problems talking, walking, or using your arms, hands, or legs.  You feel weak.  You are not thinking clearly or you have trouble forming sentences. It may take a friend or family member to notice this.  You have chest pain, abdominal pain, shortness of breath, or sweating.  Your vision changes.  You notice any bleeding.  You have side effects from medicine that seems  to be getting worse rather than better. MAKE SURE YOU:   Understand these instructions.  Will watch your condition.  Will get help right away if you are not doing well or get worse. Document Released: 08/14/2000 Document Revised: 02/23/2013 Document Reviewed: 09/07/2010 [facility] Patient Information 2015 [place], [place]. This information is not intended to replace advice given to you by your health care provider. Make sure you discuss any questions you have with your health care provider.

## 2014-07-06 NOTE — Progress Notes (Signed)
Subjective:    Patient ID: Crystal Leonard, female    DOB: 13-Mar-1970, 44 y.o.   MRN: [id number]  HPI  Crystal Leonard is a 44 yo female here for her annual exam. No PAP needed, does not want pelvic.   1) Health Maintenance-   Diet-  No changes   Wants to do the [facility] nutrition program, eat more fruits/veggies, started making smoothies  Exercise- No changes   Goal 1 mile a day walk with dogs, 1/2 mile to mile   Immunizations- None  Mammogram- Needs mammogram, self breast exams at home   Pap- Hysterectomy   Eye Exam- Needs updated exam  Dental Exam- UTD Does not want screening for STD's at this time, stable partner- husband Screened for alcohol misuse- no concerns Denies intimate partner violence, feels safe in home, has smoke detectors.   2) Chronic Problems-  Tobacco use- patches has not started yet  Depression- Stable, sees counselor, medication helpful  3) Acute Problems-  Feet and hands numb and tingling at different times for hands/feet, worse at night x 6 months and worsening course, dizziness, lasts for a few minutes, most days have 1 episode, feels light headed more than dizzy, not tried anything for this. Checks BS running "normal" within normal range (has meter from previously testing pre-diabetic). Started taking a B12 vitamin a week ago 500 mcg daily.   Review of Systems  Constitutional: Negative for fever, chills, diaphoresis, activity change, appetite change, fatigue and unexpected weight change.  HENT: Negative for tinnitus and trouble swallowing.   Eyes: Negative for visual disturbance.  Respiratory: Negative for chest tightness, shortness of breath and wheezing.   Cardiovascular: Negative for chest pain, palpitations and leg swelling.  Gastrointestinal: Negative for nausea, vomiting, abdominal pain, diarrhea, constipation and blood in stool.  Endocrine: Negative for polydipsia, polyphagia and polyuria.  Genitourinary: Negative for dysuria, hematuria, vaginal  discharge and vaginal pain.  Musculoskeletal: Positive for arthralgias. Negative for myalgias, back pain and gait problem.       Left knee, crepitus, painful when walking down stairs  Skin: Negative for color change and rash.  Neurological: Positive for light-headedness. Negative for dizziness, syncope, weakness, numbness and headaches.       Tingling in hands and feet intermittently  Hematological: Does not bruise/bleed easily.  Psychiatric/Behavioral: Negative for suicidal ideas and sleep disturbance. The patient is not nervous/anxious.    Past Medical History  Diagnosis Date  . Depression   . Chicken pox   . Headache     Frequent headaches  . Hypertension   . Kidney stones   . Migraine   . UTI (lower urinary tract infection)     History   Social History  . Marital Status: Single    Spouse Name: N/A  . Number of Children: N/A  . Years of Education: N/A   Occupational History  . Not on file.   Social History Main Topics  . Smoking status: Current Every Day Smoker -- 1.00 packs/day    Types: Cigarettes  . Smokeless tobacco: Never Used  . Alcohol Use: 0.0 oz/week    0 Standard drinks or equivalent per week     Comment: Rare occasions   . Drug Use: No  . Sexual Activity:    Partners: Male     Comment: Husband    Other Topics Concern  . Not on file   Social History Narrative   Lives with husband    Pets 4 dogs- Inside/outside dogs  No children   Jobs is a Herbalist and pet sitting    Enjoys hanging with friends and traveling     Past Surgical History  Procedure Laterality Date  . Laparoscopic endometriosis fulguration    . Abdominal hysterectomy  2012  . Tonsillectomy and adenoidectomy      Family History  Problem Relation Age of Onset  . Hypertension Mother   . Cancer Father     prostate  . Diabetes Maternal Aunt   . Hypertension Maternal Grandmother   . Diabetes Brother     Allergies  Allergen Reactions  . Tetanus Toxoids Nausea And  Vomiting    Current Outpatient Prescriptions on File Prior to Visit  Medication Sig Dispense Refill  . hydrochlorothiazide (HYDRODIURIL) 12.5 MG tablet TAKE 1 TABLET (12.5 MG TOTAL) BY MOUTH DAILY. 30 tablet 5  . lisdexamfetamine (VYVANSE) 50 MG capsule Take 50 mg by mouth daily.    . Lurasidone HCl 20 MG TABS Take 0.5 tablets by mouth.    . Multiple Vitamin (MULTIVITAMIN) tablet Take 1 tablet by mouth daily.    Marland Kitchen omeprazole (PRILOSEC) 20 MG capsule Take 20 mg by mouth as needed.     No current facility-administered medications on file prior to visit.      Objective:   Physical Exam  Constitutional: She is oriented to person, place, and time. She appears well-developed and well-nourished. No distress.  BP 121/77 mmHg  Pulse 99  Temp(Src) 98 F (36.7 C) (Oral)  Ht 5\' 5"  (1.651 m)  Wt 253 lb 4 oz (114.873 kg)  BMI 42.14 kg/m2  SpO2 97%   HENT:  Head: Normocephalic and atraumatic.  Right Ear: External ear normal.  Left Ear: External ear normal.  Nose: Nose normal.  Mouth/Throat: Oropharynx is clear and moist. No oropharyngeal exudate.  TMs and canals clear bilaterally Glasses  Eyes: Conjunctivae and EOM are normal. Pupils are equal, round, and reactive to light. Right eye exhibits no discharge. Left eye exhibits no discharge. No scleral icterus.  Neck: Normal range of motion. Neck supple. No thyromegaly present.  Cardiovascular: Normal rate, regular rhythm, normal heart sounds and intact distal pulses.  Exam reveals no gallop and no friction rub.   No murmur heard. Pulmonary/Chest: Effort normal and breath sounds normal. No respiratory distress. She has no wheezes. She has no rales. She exhibits no tenderness.  Abdominal: Soft. Bowel sounds are normal. She exhibits no distension and no mass. There is no tenderness. There is no rebound and no guarding.  Soft obese  Genitourinary:  Deferred per pt  Musculoskeletal: Normal range of motion. She exhibits no edema or tenderness.    Lymphadenopathy:    She has no cervical adenopathy.  Neurological: She is alert and oriented to person, place, and time. She has normal reflexes. No cranial nerve deficit. She exhibits normal muscle tone. Coordination normal.  Skin: Skin is warm and dry. No rash noted. She is not diaphoretic. No erythema. No pallor.  Psychiatric: She has a normal mood and affect. Her behavior is normal. Judgment and thought content normal.      Assessment & Plan:

## 2014-07-06 NOTE — Assessment & Plan Note (Signed)
Stable. Seeing psych, on medications.

## 2014-07-06 NOTE — Progress Notes (Signed)
Pre visit review using our clinic review tool, if applicable. No additional management support is needed unless otherwise documented below in the visit note. 

## 2014-07-06 NOTE — Assessment & Plan Note (Signed)
Intermittent lightheadedness worsening. Will check lab work today, asked her to stay hydrated, do not move quickly, take claritin daily, and gave handout with dizziness info. Do not think it is vertigo, does not happen with overhead motions except for 1 episode.

## 2014-07-06 NOTE — Assessment & Plan Note (Signed)
Stable on HCTZ

## 2014-07-14 ENCOUNTER — Other Ambulatory Visit: Payer: Self-pay | Admitting: Nurse Practitioner

## 2014-07-14 ENCOUNTER — Encounter: Payer: Self-pay | Admitting: Nurse Practitioner

## 2014-07-14 DIAGNOSIS — E785 Hyperlipidemia, unspecified: Secondary | ICD-10-CM

## 2014-07-14 MED ORDER — ATORVASTATIN CALCIUM 10 MG PO TABS: 10.0000 mg | ORAL_TABLET | Freq: Every day | ORAL | Status: DC

## 2014-07-25 ENCOUNTER — Ambulatory Visit (INDEPENDENT_AMBULATORY_CARE_PROVIDER_SITE_OTHER): Payer: BC Managed Care – PPO | Admitting: Nurse Practitioner

## 2014-07-25 ENCOUNTER — Encounter: Payer: Self-pay | Admitting: Nurse Practitioner

## 2014-07-25 VITALS — BP 110/70 | HR 100 | Temp 97.8°F | Resp 14 | Ht 65.0 in | Wt 252.1 lb

## 2014-07-25 DIAGNOSIS — J069 Acute upper respiratory infection, unspecified: Secondary | ICD-10-CM | POA: Diagnosis not present

## 2014-07-25 MED ORDER — AZITHROMYCIN 250 MG PO TABS: ORAL_TABLET | ORAL | Status: DC

## 2014-07-25 MED ORDER — HYDROCOD POLST-CPM POLST ER 10-8 MG/5ML PO SUER: 5.0000 mL | Freq: Every evening | ORAL | Status: DC | PRN

## 2014-07-25 NOTE — Progress Notes (Signed)
   Subjective:    Patient ID: Crystal Leonard, female    DOB: Aug 24, 1970, 44 y.o.   MRN: [id number]  HPI  Crystal Leonard is a 44 yo female with a CC of coughing, burns x 7 days. Not sleeping for 3 nights.   1) Not sleeping from coughing, hacking cough, worse at night, not sleeping well, general aching first few days. No one else in the family is sick.   Tylenol Cold/Flu  Mucinex Vicks Vaporub  Allergy medication  Review of Systems  Constitutional: Negative for fever, chills, diaphoresis and fatigue.  HENT: Positive for congestion and sneezing. Negative for ear discharge, ear pain, sinus pressure and sore throat.   Eyes: Negative for discharge and itching.  Respiratory: Positive for cough. Negative for chest tightness, shortness of breath and wheezing.   Cardiovascular: Negative for chest pain, palpitations and leg swelling.  Gastrointestinal: Negative for nausea, vomiting, diarrhea and constipation.  Musculoskeletal: Positive for myalgias.  Skin: Negative for rash.  Neurological: Negative for dizziness, weakness, numbness and headaches.      Objective:   Physical Exam  Constitutional: She is oriented to person, place, and time. She appears well-developed and well-nourished. No distress.  BP 110/70 mmHg  Pulse 100  Temp(Src) 97.8 F (36.6 C) (Oral)  Resp 14  Ht 5\' 5"  (1.651 m)  Wt 252 lb 1.9 oz (114.361 kg)  BMI 41.96 kg/m2  SpO2 97%   HENT:  Head: Normocephalic and atraumatic.  Right Ear: External ear normal.  Left Ear: External ear normal.  Mouth/Throat: No oropharyngeal exudate.  Eyes: EOM are normal. Pupils are equal, round, and reactive to light. Right eye exhibits no discharge. Left eye exhibits no discharge. No scleral icterus.  Neck: Normal range of motion. Neck supple. No thyromegaly present.  Cardiovascular: Normal rate, regular rhythm and normal heart sounds.  Exam reveals no gallop and no friction rub.   No murmur heard. Pulmonary/Chest: Effort normal and  breath sounds normal. No respiratory distress. She has no wheezes. She has no rales. She exhibits no tenderness.  Lymphadenopathy:    She has no cervical adenopathy.  Neurological: She is alert and oriented to person, place, and time. No cranial nerve deficit. She exhibits normal muscle tone. Coordination normal.  Skin: Skin is warm and dry. No rash noted. She is not diaphoretic.  Psychiatric: She has a normal mood and affect. Her behavior is normal. Judgment and thought content normal.      Assessment & Plan:

## 2014-07-25 NOTE — Assessment & Plan Note (Signed)
No adventitious lung sounds. Will start on Tussionex syrup gave instructions and warned of sedation. Sending in [place] since no relief or improvement in 7 days. FU prn worsening/failure to improve.

## 2014-07-25 NOTE — Progress Notes (Signed)
Pre visit review using our clinic review tool, if applicable. No additional management support is needed unless otherwise documented below in the visit note. 

## 2014-07-25 NOTE — Patient Instructions (Signed)
Upper Respiratory Infection, Adult An upper respiratory infection (URI) is also sometimes known as the common cold. The upper respiratory tract includes the nose, sinuses, throat, trachea, and bronchi. Bronchi are the airways leading to the lungs. Most people improve within 1 week, but symptoms can last up to 2 weeks. A residual cough may last even longer.  CAUSES Many different viruses can infect the tissues lining the upper respiratory tract. The tissues become irritated and inflamed and often become very moist. Mucus production is also common. A cold is contagious. You can easily spread the virus to others by oral contact. This includes kissing, sharing a glass, coughing, or sneezing. Touching your mouth or nose and then touching a surface, which is then touched by another person, can also spread the virus. SYMPTOMS  Symptoms typically develop 1 to 3 days after you come in contact with a cold virus. Symptoms vary from person to person. They may include:  Runny nose.  Sneezing.  Nasal congestion.  Sinus irritation.  Sore throat.  Loss of voice (laryngitis).  Cough.  Fatigue.  Muscle aches.  Loss of appetite.  Headache.  Low-grade fever. DIAGNOSIS  You might diagnose your own cold based on familiar symptoms, since most people get a cold 2 to 3 times a year. Your caregiver can confirm this based on your exam. Most importantly, your caregiver can check that your symptoms are not due to another disease such as strep throat, sinusitis, pneumonia, asthma, or epiglottitis. Blood tests, throat tests, and X-rays are not necessary to diagnose a common cold, but they may sometimes be helpful in excluding other more serious diseases. Your caregiver will decide if any further tests are required. RISKS AND COMPLICATIONS  You may be at risk for a more severe case of the common cold if you smoke cigarettes, have chronic heart disease (such as heart failure) or lung disease (such as asthma), or if  you have a weakened immune system. The very young and very old are also at risk for more serious infections. Bacterial sinusitis, middle ear infections, and bacterial pneumonia can complicate the common cold. The common cold can worsen asthma and chronic obstructive pulmonary disease (COPD). Sometimes, these complications can require emergency medical care and may be life-threatening. PREVENTION  The best way to protect against getting a cold is to practice good hygiene. Avoid oral or hand contact with people with cold symptoms. Wash your hands often if contact occurs. There is no clear evidence that vitamin C, vitamin E, echinacea, or exercise reduces the chance of developing a cold. However, it is always recommended to get plenty of rest and practice good nutrition. TREATMENT  Treatment is directed at relieving symptoms. There is no cure. Antibiotics are not effective, because the infection is caused by a virus, not by bacteria. Treatment may include:  Increased fluid intake. Sports drinks offer valuable electrolytes, sugars, and fluids.  Breathing heated mist or steam (vaporizer or shower).  Eating chicken soup or other clear broths, and maintaining good nutrition.  Getting plenty of rest.  Using gargles or lozenges for comfort.  Controlling fevers with ibuprofen or acetaminophen as directed by your caregiver.  Increasing usage of your inhaler if you have asthma. Zinc gel and zinc lozenges, taken in the first 24 hours of the common cold, can shorten the duration and lessen the severity of symptoms. Pain medicines may help with fever, muscle aches, and throat pain. A variety of non-prescription medicines are available to treat congestion and runny nose. Your caregiver   can make recommendations and may suggest nasal or lung inhalers for other symptoms.  HOME CARE INSTRUCTIONS   Only take over-the-counter or prescription medicines for pain, discomfort, or fever as directed by your  caregiver.  Use a warm mist humidifier or inhale steam from a shower to increase air moisture. This may keep secretions moist and make it easier to breathe.  Drink enough water and fluids to keep your urine clear or pale yellow.  Rest as needed.  Return to work when your temperature has returned to normal or as your caregiver advises. You may need to stay home longer to avoid infecting others. You can also use a face mask and careful hand washing to prevent spread of the virus. SEEK MEDICAL CARE IF:   After the first few days, you feel you are getting worse rather than better.  You need your caregiver's advice about medicines to control symptoms.  You develop chills, worsening shortness of breath, or brown or red sputum. These may be signs of pneumonia.  You develop yellow or brown nasal discharge or pain in the face, especially when you bend forward. These may be signs of sinusitis.  You develop a fever, swollen neck glands, pain with swallowing, or white areas in the back of your throat. These may be signs of strep throat. SEEK IMMEDIATE MEDICAL CARE IF:   You have a fever.  You develop severe or persistent headache, ear pain, sinus pain, or chest pain.  You develop wheezing, a prolonged cough, cough up blood, or have a change in your usual mucus (if you have chronic lung disease).  You develop sore muscles or a stiff neck. Document Released: 08/14/2000 Document Revised: 05/13/2011 Document Reviewed: 05/26/2013 Exit[employer] Patient Information 2015 [employer]. This information is not intended to replace advice given to you by your health care provider. Make sure you discuss any questions you have with your health care provider.  

## 2014-07-28 ENCOUNTER — Other Ambulatory Visit: Payer: Self-pay | Admitting: Nurse Practitioner

## 2014-07-28 DIAGNOSIS — Z5181 Encounter for therapeutic drug level monitoring: Secondary | ICD-10-CM

## 2014-07-28 DIAGNOSIS — Z79899 Other long term (current) drug therapy: Principal | ICD-10-CM

## 2014-10-11 ENCOUNTER — Other Ambulatory Visit: Payer: Self-pay | Admitting: Nurse Practitioner

## 2014-10-19 ENCOUNTER — Ambulatory Visit (INDEPENDENT_AMBULATORY_CARE_PROVIDER_SITE_OTHER): Payer: BC Managed Care – PPO | Admitting: Nurse Practitioner

## 2014-10-19 ENCOUNTER — Encounter: Payer: Self-pay | Admitting: Nurse Practitioner

## 2014-10-19 DIAGNOSIS — L02419 Cutaneous abscess of limb, unspecified: Secondary | ICD-10-CM | POA: Insufficient documentation

## 2014-10-19 DIAGNOSIS — L723 Sebaceous cyst: Secondary | ICD-10-CM

## 2014-10-19 MED ORDER — TRAMADOL HCL 50 MG PO TABS: 50.0000 mg | ORAL_TABLET | Freq: Three times a day (TID) | ORAL | Status: DC | PRN

## 2014-10-19 MED ORDER — DOXYCYCLINE HYCLATE 100 MG PO TABS: 100.0000 mg | ORAL_TABLET | Freq: Two times a day (BID) | ORAL | Status: DC

## 2014-10-19 NOTE — Progress Notes (Signed)
Patient ID: Crystal Leonard, female    DOB: September 11, 1970  Age: 44 y.o. MRN: [id number]  CC: Cyst   HPI Crystal Leonard presents for abscess that is draining under right arm 3 days.   1) Abscess- patient has had a pea-sized area under her right arm near the axilla for 4-5 years and this was checked out by her dermatologist. Over the weekend she noticed it became bigger and became more painful. The redness and pain were bothering her so she decided to make an appointment. She noticed some drainage yesterday and today- purulent.   History Crystal Leonard has a past medical history of Depression; Chicken pox; Headache; Hypertension; Kidney stones; Migraine; and UTI (lower urinary tract infection).   She has past surgical history that includes Laparoscopic endometriosis fulguration; Abdominal hysterectomy (2012); and Tonsillectomy and adenoidectomy.   Her family history includes Cancer in her father; Diabetes in her brother and maternal aunt; Hypertension in her maternal grandmother and mother.She reports that she has been smoking Cigarettes.  She has been smoking about 1.00 pack per day. She has never used smokeless tobacco. She reports that she drinks alcohol. She reports that she does not use illicit drugs.  Outpatient Prescriptions Prior to Visit  Medication Sig Dispense Refill  . atorvastatin (LIPITOR) 10 MG tablet TAKE 1 TABLET (10 MG TOTAL) BY MOUTH DAILY. 90 tablet 0  . hydrochlorothiazide (HYDRODIURIL) 12.5 MG tablet TAKE 1 TABLET (12.5 MG TOTAL) BY MOUTH DAILY. 30 tablet 5  . lisdexamfetamine (VYVANSE) 50 MG capsule Take 50 mg by mouth daily.    . Lurasidone HCl 20 MG TABS Take 0.5 tablets by mouth.    . Multiple Vitamin (MULTIVITAMIN) tablet Take 1 tablet by mouth daily.    Marland Kitchen omeprazole (PRILOSEC) 20 MG capsule Take 20 mg by mouth as needed.    . vitamin B-12 (CYANOCOBALAMIN) 500 MCG tablet Take 500 mcg by mouth daily.    Marland Kitchen azithromycin (ZITHROMAX) 250 MG tablet Take 2 tablets by mouth on day  1, take 1 tablet by mouth each day after for 4 days. 6 each 0  . chlorpheniramine-HYDROcodone (TUSSIONEX PENNKINETIC ER) 10-8 MG/5ML SUER Take 5 mLs by mouth at bedtime as needed for cough. 115 mL 0   No facility-administered medications prior to visit.    ROS Review of Systems  Constitutional: Negative for fever, chills, diaphoresis and fatigue.  Skin: Positive for color change and wound. Negative for rash.    Objective:  BP 108/70 mmHg  Pulse 96  Temp(Src) 98.6 F (37 C)  Resp 14  Ht 5\' 5"  (1.651 m)  Wt 253 lb (114.76 kg)  BMI 42.10 kg/m2  SpO2 96%  Physical Exam  Constitutional: She is oriented to person, place, and time. She appears well-developed and well-nourished. No distress.  HENT:  Head: Normocephalic and atraumatic.  Right Ear: External ear normal.  Left Ear: External ear normal.  Neurological: She is alert and oriented to person, place, and time. No cranial nerve deficit. She exhibits normal muscle tone. Coordination normal.  Skin: Skin is warm and dry.     Psychiatric: She has a normal mood and affect. Her behavior is normal. Judgment and thought content normal.      Assessment & Plan:   Adryan was seen today for cyst.  Diagnoses and all orders for this visit:  Abscess of axilla -     Wound culture  Other orders -     traMADol (ULTRAM) 50 MG tablet; Take 1 tablet (50 mg total) by  mouth every 8 (eight) hours as needed. -     doxycycline (VIBRA-TABS) 100 MG tablet; Take 1 tablet (100 mg total) by mouth 2 (two) times daily.  I have discontinued Ms. Espindola's azithromycin and chlorpheniramine-HYDROcodone. I am also having her start on traMADol and doxycycline. Additionally, I am having her maintain her omeprazole, multivitamin, Lurasidone HCl, lisdexamfetamine, hydrochlorothiazide, vitamin B-12, and atorvastatin.  Meds ordered this encounter  Medications  . traMADol (ULTRAM) 50 MG tablet    Sig: Take 1 tablet (50 mg total) by mouth every 8 (eight)  hours as needed.    Dispense:  30 tablet    Refill:  0    Order Specific Question:  Supervising Provider    Answer:  Deborra Medina L [[id number]]  . doxycycline (VIBRA-TABS) 100 MG tablet    Sig: Take 1 tablet (100 mg total) by mouth 2 (two) times daily.    Dispense:  20 tablet    Refill:  0    Order Specific Question:  Supervising Provider    Answer:  Crecencio Mc [[id number]]     Follow-up: Return in about 1 week (around 10/26/2014) for S/p I&D.

## 2014-10-19 NOTE — Patient Instructions (Signed)
Incision and Drainage Care After Refer to this sheet in the next few weeks. These instructions provide you with information on caring for yourself after your procedure. Your caregiver may also give you more specific instructions. Your treatment has been planned according to current medical practices, but problems sometimes occur. Call your caregiver if you have any problems or questions after your procedure. HOME CARE INSTRUCTIONS   If antibiotic medicine is given, take it as directed. Finish it even if you start to feel better.  Only take over-the-counter or prescription medicines for pain, discomfort, or fever as directed by your caregiver.  Keep all follow-up appointments as directed by your caregiver.  Change any bandages (dressings) as directed by your caregiver. Replace old dressings with clean dressings.  Wash your hands before and after caring for your wound. You will receive specific instructions for cleansing and caring for your wound.  SEEK MEDICAL CARE IF:   You have increased pain, swelling, or redness around the wound.  You have increased drainage, smell, or bleeding from the wound.  You have muscle aches, chills, or you feel generally sick.  You have a fever. MAKE SURE YOU:   Understand these instructions.  Will watch your condition.  Will get help right away if you are not doing well or get worse. Document Released: 05/13/2011 Document Reviewed: 05/13/2011 Exit[employer] Patient Information 2015 [employer]. This information is not intended to replace advice given to you by your health care provider. Make sure you discuss any questions you have with your health care provider.  

## 2014-10-19 NOTE — Assessment & Plan Note (Signed)
Risks and benefits explained to patient, informed consent was obtained, time out was performed before procedure, and all questions were answered satisfactorily. Alcohol to wipe 2 cm area, 2% lidocaine with Epi was injected, adequate local anesthesia obtained before proceeding, patient was adequately positioned, a fenestrated drape was placed, povidone-iodine with sterile gloving was applied, once dry a 2 mm incision with an 11 blade was made centrally, purulent discharge was copious and non malodorous, there was adequate drainage and it was felt that a secondary intention closure would be adequate and allow for full drainage. Telfa with tegaderm for dressing. Cleaned patient with alcohol wipe in bloodied areas, leaving some of the iodine to continue working antimicrobially. Gave patient care instructions, answered remaining questions, will follow up in 1 week. Doxycycline prescription sent to pharmacy and encouraged probiotics. Patient was concerned about pain as well so a tramadol script was given to pt in case Advil is not helpful. Patient tolerated I&D well.

## 2014-10-22 LAB — WOUND CULTURE
Gram Stain: NONE SEEN
Gram Stain: NONE SEEN
Organism ID, Bacteria: NO GROWTH

## 2014-10-25 ENCOUNTER — Encounter: Payer: Self-pay | Admitting: Nurse Practitioner

## 2014-10-31 ENCOUNTER — Other Ambulatory Visit: Payer: Self-pay | Admitting: Surgical

## 2014-10-31 MED ORDER — HYDROCHLOROTHIAZIDE 12.5 MG PO TABS: ORAL_TABLET | ORAL | Status: DC

## 2014-10-31 NOTE — Telephone Encounter (Signed)
Sent RX to pharmacy 

## 2014-11-17 ENCOUNTER — Telehealth: Payer: Self-pay | Admitting: Nurse Practitioner

## 2014-11-17 ENCOUNTER — Ambulatory Visit (INDEPENDENT_AMBULATORY_CARE_PROVIDER_SITE_OTHER): Payer: BC Managed Care – PPO | Admitting: Nurse Practitioner

## 2014-11-17 ENCOUNTER — Encounter: Payer: Self-pay | Admitting: Nurse Practitioner

## 2014-11-17 VITALS — BP 106/80 | HR 103 | Temp 98.4°F | Resp 14 | Ht 65.0 in | Wt 251.2 lb

## 2014-11-17 DIAGNOSIS — R202 Paresthesia of skin: Secondary | ICD-10-CM | POA: Diagnosis not present

## 2014-11-17 MED ORDER — PREDNISONE 10 MG PO TABS: ORAL_TABLET | ORAL | Status: DC

## 2014-11-17 NOTE — Telephone Encounter (Signed)
[facility] Patient Name: Crystal Leonard DOB: 11-13-1970 Initial Comment Caller states she is having numbness and tingling in her right hand and moving in her arm for 2-3 days. Nurse Assessment Nurse: Mechele Dawley, RN, Amy Date/Time Eilene Ghazi Time): 11/17/2014 2:40:32 PM Confirm and document reason for call. If symptomatic, describe symptoms. ---RIGHT ARM WITH SOME TINGLING THAT IS GOING UP HER ARM. THE TINGLING WAS IN THE HAND FOR A WEEK AND NOW ITS GOING INTO THE ARM. SHE IS A SIDE SLEEPER AND SHE DID NOT SLEEP. SHE IS WANDERING IF IT IS A PINCHED NERVE. SHE DENIES THIS HAPPENING BEFORE. Has the patient traveled out of the country within the last 30 days? ---Not Applicable Does the patient require triage? ---Yes Related visit to physician within the last 2 weeks? ---No Does the PT have any chronic conditions? (i.e. diabetes, asthma, etc.) ---No Did the patient indicate they were pregnant? ---No Guidelines Guideline Title Affirmed Question Affirmed Notes Neurologic Deficit [1] Numbness (i.e., loss of sensation) of the face, arm / hand, or leg / foot on one side of the body AND [2] gradual onset (e.g., days to weeks) AND [3] present now Final Disposition User See Physician within 4 Hours (or PCP triage) Mechele Dawley, RN, Amy Referrals REFERRED TO PCP OFFICE Disagree/Comply: Comply

## 2014-11-17 NOTE — Patient Instructions (Signed)
Prednisone with breakfast  6 tablets on day 1, 5 tablets on day 2, 4 tablets on day 3, 3 tablets on day 4, 2 tablets day 5, 1 tablet on day 6...done!   No NSAIDs like Aspirin with this.

## 2014-11-17 NOTE — Progress Notes (Signed)
Pre visit review using our clinic review tool, if applicable. No additional management support is needed unless otherwise documented below in the visit note. 

## 2014-11-17 NOTE — Assessment & Plan Note (Signed)
Right hand worsening recently. Discussed not using pocket book on the right shoulder, discussed treatment options. Pt in favor of trying prednisone taper to decrease inflammation. Discussed no NSAID use during treatment, daily stretching, and warm showers. Probable pinched nerve. Asked pt to call Monday if not helpful. Will obtain x-ray of cervical spine if uncontrolled.

## 2014-11-17 NOTE — Progress Notes (Signed)
Patient ID: Crystal Leonard, female    DOB: 10-Mar-1970  Age: 44 y.o. MRN: [id number]  CC: Numbness   HPI VERNICA WACHTEL presents for right hand numbness x 1 week.   Onset- 1 week  Location- right hand radiates up arm (started last night up the arm) Duration - Constant  Characteristics- ache, heavy, numbness Aggravating factors- Carrying pocket book on that shoulder, sleeping on side  Relieving factors- Better with rest in a neutral position  Severity- 4/10   Treatment- Nothing to date  History Diamonds has a past medical history of Depression; Chicken pox; Headache; Hypertension; Kidney stones; Migraine; and UTI (lower urinary tract infection).   She has past surgical history that includes Laparoscopic endometriosis fulguration; Abdominal hysterectomy (2012); and Tonsillectomy and adenoidectomy.   Her family history includes Cancer in her father; Diabetes in her brother and maternal aunt; Hypertension in her maternal grandmother and mother.She reports that she has been smoking Cigarettes.  She has been smoking about 1.00 pack per day. She has never used smokeless tobacco. She reports that she drinks alcohol. She reports that she does not use illicit drugs.  Outpatient Prescriptions Prior to Visit  Medication Sig Dispense Refill  . atorvastatin (LIPITOR) 10 MG tablet TAKE 1 TABLET (10 MG TOTAL) BY MOUTH DAILY. 90 tablet 0  . doxycycline (VIBRA-TABS) 100 MG tablet Take 1 tablet (100 mg total) by mouth 2 (two) times daily. 20 tablet 0  . hydrochlorothiazide (HYDRODIURIL) 12.5 MG tablet TAKE 1 TABLET (12.5 MG TOTAL) BY MOUTH DAILY. 90 tablet 1  . lisdexamfetamine (VYVANSE) 50 MG capsule Take 50 mg by mouth daily.    . Lurasidone HCl 20 MG TABS Take 0.5 tablets by mouth.    . Multiple Vitamin (MULTIVITAMIN) tablet Take 1 tablet by mouth daily.    Marland Kitchen omeprazole (PRILOSEC) 20 MG capsule Take 20 mg by mouth as needed.    . traMADol (ULTRAM) 50 MG tablet Take 1 tablet (50 mg total) by mouth  every 8 (eight) hours as needed. 30 tablet 0  . vitamin B-12 (CYANOCOBALAMIN) 500 MCG tablet Take 500 mcg by mouth daily.     No facility-administered medications prior to visit.   ROS Review of Systems  Constitutional: Negative for fever, chills, diaphoresis and fatigue.  Gastrointestinal: Negative for nausea, vomiting and diarrhea.  Musculoskeletal: Positive for myalgias.  Neurological: Positive for numbness. Negative for dizziness, weakness and headaches.   Objective:  BP 106/80 mmHg  Pulse 103  Temp(Src) 98.4 F (36.9 C)  Resp 14  Ht 5\' 5"  (1.651 m)  Wt 251 lb 3.2 oz (113.944 kg)  BMI 41.80 kg/m2  SpO2 96%  Physical Exam  Constitutional: She is oriented to person, place, and time. She appears well-developed and well-nourished. No distress.  HENT:  Head: Normocephalic and atraumatic.  Right Ear: External ear normal.  Left Ear: External ear normal.  Cardiovascular: Normal rate, regular rhythm and normal heart sounds.   Pulmonary/Chest: Effort normal and breath sounds normal. No respiratory distress. She has no wheezes. She has no rales. She exhibits no tenderness.  Musculoskeletal: She exhibits tenderness. She exhibits no edema.  Tender trapezius muscle on right and paraspinal muscles of cervical spine. Minor wincing, but able to perform full ROM of right shoulder, Negative impingement sign, negative drop arm 5/5 strength of biceps and deltoids bilaterally   Neurological: She is alert and oriented to person, place, and time. She displays normal reflexes. No cranial nerve deficit. She exhibits normal muscle tone. Coordination normal.  Skin:  Skin is warm and dry. No rash noted. She is not diaphoretic.  Psychiatric: She has a normal mood and affect. Her behavior is normal. Judgment and thought content normal.   Assessment & Plan:   Chetara was seen today for numbness.  Diagnoses and all orders for this visit:  Right hand paresthesia  Other orders -     predniSONE  (DELTASONE) 10 MG tablet; Take 6 tablets by mouth on day 1 with breakfast then decrease by 1 tablet each day until gone.  I am having Ms. Briney start on predniSONE. I am also having her maintain her omeprazole, multivitamin, Lurasidone HCl, lisdexamfetamine, vitamin B-12, atorvastatin, traMADol, doxycycline, and hydrochlorothiazide.  Meds ordered this encounter  Medications  . predniSONE (DELTASONE) 10 MG tablet    Sig: Take 6 tablets by mouth on day 1 with breakfast then decrease by 1 tablet each day until gone.    Dispense:  21 tablet    Refill:  0    Order Specific Question:  Supervising Provider    Answer:  Crecencio Mc [[id number]]     Follow-up: Return if symptoms worsen or fail to improve.

## 2014-11-25 ENCOUNTER — Encounter: Payer: Self-pay | Admitting: Nurse Practitioner

## 2014-11-25 ENCOUNTER — Ambulatory Visit (INDEPENDENT_AMBULATORY_CARE_PROVIDER_SITE_OTHER): Payer: BC Managed Care – PPO | Admitting: Nurse Practitioner

## 2014-11-25 VITALS — BP 110/90 | HR 93 | Temp 98.1°F | Resp 16 | Ht 65.0 in | Wt 253.2 lb

## 2014-11-25 DIAGNOSIS — R202 Paresthesia of skin: Secondary | ICD-10-CM | POA: Diagnosis not present

## 2014-11-25 DIAGNOSIS — R200 Anesthesia of skin: Secondary | ICD-10-CM | POA: Insufficient documentation

## 2014-11-25 NOTE — Assessment & Plan Note (Signed)
Uncontrolled. Will obtain cervical and right shoulder x-rays. Will follow after results.

## 2014-11-25 NOTE — Progress Notes (Signed)
Pre visit review using our clinic review tool, if applicable. No additional management support is needed unless otherwise documented below in the visit note. 

## 2014-11-25 NOTE — Progress Notes (Signed)
Patient ID: Crystal Leonard, female    DOB: 1971/01/13  Age: 44 y.o. MRN: [id number]  CC: Numbness   HPI Crystal Leonard presents for follow up of right arm/hand worsening pain and numbness.   1) Arm pain on right with numbness of hand  Stayed out of work and did not move it and it felt better the next day.  Worse with moving shoulder or reaching up. Numbness worsening at night.  Prednisone taper not helpful.    History Crystal Leonard has a past medical history of Depression; Chicken pox; Headache; Hypertension; Kidney stones; Migraine; and UTI (lower urinary tract infection).   Crystal Leonard has past surgical history that includes Laparoscopic endometriosis fulguration; Abdominal hysterectomy (2012); and Tonsillectomy and adenoidectomy.   Crystal Leonard family history includes Cancer in Crystal Leonard father; Diabetes in Crystal Leonard brother and maternal aunt; Hypertension in Crystal Leonard maternal grandmother and mother.Crystal Leonard reports that Crystal Leonard has been smoking Cigarettes.  Crystal Leonard has been smoking about 1.00 pack per day. Crystal Leonard has never used smokeless tobacco. Crystal Leonard reports that Crystal Leonard drinks alcohol. Crystal Leonard reports that Crystal Leonard does not use illicit drugs.  Outpatient Prescriptions Prior to Visit  Medication Sig Dispense Refill  . atorvastatin (LIPITOR) 10 MG tablet TAKE 1 TABLET (10 MG TOTAL) BY MOUTH DAILY. 90 tablet 0  . doxycycline (VIBRA-TABS) 100 MG tablet Take 1 tablet (100 mg total) by mouth 2 (two) times daily. 20 tablet 0  . hydrochlorothiazide (HYDRODIURIL) 12.5 MG tablet TAKE 1 TABLET (12.5 MG TOTAL) BY MOUTH DAILY. 90 tablet 1  . lisdexamfetamine (VYVANSE) 50 MG capsule Take 50 mg by mouth daily.    . Lurasidone HCl 20 MG TABS Take 0.5 tablets by mouth.    . Multiple Vitamin (MULTIVITAMIN) tablet Take 1 tablet by mouth daily.    Marland Kitchen omeprazole (PRILOSEC) 20 MG capsule Take 20 mg by mouth as needed.    . predniSONE (DELTASONE) 10 MG tablet Take 6 tablets by mouth on day 1 with breakfast then decrease by 1 tablet each day until gone. 21 tablet 0  .  traMADol (ULTRAM) 50 MG tablet Take 1 tablet (50 mg total) by mouth every 8 (eight) hours as needed. 30 tablet 0  . vitamin B-12 (CYANOCOBALAMIN) 500 MCG tablet Take 500 mcg by mouth daily.     No facility-administered medications prior to visit.    ROS Review of Systems  Constitutional: Negative for fever, chills, diaphoresis and fatigue.  Musculoskeletal: Positive for arthralgias.       Right shoulder  Neurological: Positive for numbness. Negative for dizziness, weakness and headaches.   Objective:  BP 110/90 mmHg  Pulse 93  Temp(Src) 98.1 F (36.7 C)  Resp 16  Ht 5\' 5"  (1.651 m)  Wt 253 lb 3.2 oz (114.851 kg)  BMI 42.13 kg/m2  SpO2 96%  Physical Exam  Constitutional: Crystal Leonard is oriented to person, place, and time. Crystal Leonard appears well-developed and well-nourished. No distress.  HENT:  Head: Normocephalic and atraumatic.  Right Ear: External ear normal.  Left Ear: External ear normal.  Cardiovascular: Normal rate, regular rhythm and normal heart sounds.   Pulmonary/Chest: Effort normal and breath sounds normal. No respiratory distress. Crystal Leonard has no wheezes. Crystal Leonard has no rales. Crystal Leonard exhibits no tenderness.  Musculoskeletal: Normal range of motion. Crystal Leonard exhibits tenderness. Crystal Leonard exhibits no edema.  Negative tinels or phalens at the median nerve and ulnar nerve, negative impingement. Tender to palpation cervical spine, paracervical muscles, and posterior shoulder  Neurological: Crystal Leonard is alert and oriented to person, place, and time. No cranial  nerve deficit. Crystal Leonard exhibits normal muscle tone. Coordination normal.  Skin: Skin is warm and dry. No rash noted. Crystal Leonard is not diaphoretic.  Psychiatric: Crystal Leonard has a normal mood and affect. Crystal Leonard behavior is normal. Judgment and thought content normal.   Assessment & Plan:   Crystal Leonard was seen today for numbness.  Diagnoses and all orders for this visit:  Numbness and tingling in right hand -     DG Cervical Spine Complete; Future -     DG Shoulder Right;  Future   I am having Crystal Leonard maintain Crystal Leonard omeprazole, multivitamin, Lurasidone HCl, lisdexamfetamine, vitamin B-12, atorvastatin, traMADol, doxycycline, hydrochlorothiazide, and predniSONE.  No orders of the defined types were placed in this encounter.     Follow-up: Return if symptoms worsen or fail to improve.

## 2014-11-25 NOTE — Patient Instructions (Signed)
[facility]  MF 8-5 walk in for x-rays.

## 2014-11-29 ENCOUNTER — Ambulatory Visit
Admission: RE | Admit: 2014-11-29 | Discharge: 2014-11-29 | Disposition: A | Discharge status: Home / Self Care | Payer: BC Managed Care – PPO | Source: Ambulatory Visit | Attending: Nurse Practitioner | Admitting: Nurse Practitioner

## 2014-11-29 ENCOUNTER — Encounter: Payer: Self-pay | Admitting: Nurse Practitioner

## 2014-11-29 DIAGNOSIS — R202 Paresthesia of skin: Secondary | ICD-10-CM

## 2014-11-29 DIAGNOSIS — M542 Cervicalgia: Secondary | ICD-10-CM | POA: Diagnosis present

## 2014-11-29 DIAGNOSIS — M25511 Pain in right shoulder: Secondary | ICD-10-CM | POA: Diagnosis not present

## 2014-11-29 DIAGNOSIS — R200 Anesthesia of skin: Secondary | ICD-10-CM

## 2015-02-09 ENCOUNTER — Other Ambulatory Visit: Payer: Self-pay | Admitting: Nurse Practitioner

## 2015-05-09 LAB — CBC AND DIFFERENTIAL
HCT: 48 % — AB (ref 36–46)
Hemoglobin: 16 g/dL (ref 12.0–16.0)
Neutrophils Absolute: 7 /uL
Platelets: 458 10*3/uL — AB (ref 150–399)
WBC: 11.4 10^3/mL

## 2015-05-09 LAB — TSH: TSH: 1.22 u[IU]/mL (ref 0.41–5.90)

## 2015-05-09 LAB — BASIC METABOLIC PANEL
Creatinine: 0.9 mg/dL (ref 0.5–1.1)
Glucose: 103 mg/dL
Potassium: 3.9 mmol/L (ref 3.4–5.3)
Sodium: 139 mmol/L (ref 137–147)

## 2015-05-25 ENCOUNTER — Other Ambulatory Visit: Payer: Self-pay | Admitting: Family Medicine

## 2015-06-21 ENCOUNTER — Other Ambulatory Visit: Payer: Self-pay | Admitting: Nurse Practitioner

## 2015-08-02 ENCOUNTER — Ambulatory Visit (INDEPENDENT_AMBULATORY_CARE_PROVIDER_SITE_OTHER): Payer: BC Managed Care – PPO | Admitting: Family Medicine

## 2015-08-02 ENCOUNTER — Encounter: Payer: Self-pay | Admitting: Family Medicine

## 2015-08-02 VITALS — BP 126/82 | HR 104 | Temp 97.6°F | Ht 65.0 in | Wt 234.2 lb

## 2015-08-02 DIAGNOSIS — R05 Cough: Secondary | ICD-10-CM | POA: Diagnosis not present

## 2015-08-02 DIAGNOSIS — H66001 Acute suppurative otitis media without spontaneous rupture of ear drum, right ear: Secondary | ICD-10-CM | POA: Diagnosis not present

## 2015-08-02 DIAGNOSIS — R059 Cough, unspecified: Secondary | ICD-10-CM

## 2015-08-02 DIAGNOSIS — H6690 Otitis media, unspecified, unspecified ear: Secondary | ICD-10-CM | POA: Insufficient documentation

## 2015-08-02 DIAGNOSIS — R058 Other specified cough: Secondary | ICD-10-CM | POA: Insufficient documentation

## 2015-08-02 MED ORDER — HYDROCOD POLST-CPM POLST ER 10-8 MG/5ML PO SUER: 5.0000 mL | Freq: Two times a day (BID) | ORAL | Status: DC | PRN

## 2015-08-02 MED ORDER — AMOXICILLIN-POT CLAVULANATE 875-125 MG PO TABS: 1.0000 | ORAL_TABLET | Freq: Two times a day (BID) | ORAL | Status: DC

## 2015-08-02 MED ORDER — PREDNISONE 50 MG PO TABS: ORAL_TABLET | ORAL | Status: DC

## 2015-08-02 NOTE — Assessment & Plan Note (Signed)
New acute problem. Treating with Augmentin. 

## 2015-08-02 NOTE — Progress Notes (Signed)
Subjective:  Patient ID: Crystal Leonard, female    DOB: Feb 14, 1971  Age: 45 y.o. MRN: [id number]  CC: Cough, congestion, ear pain  HPI:  45 year old female presents with the above complaint.  Patient states that she's been sick for the past week. She's been experiencing cough, congestion, and right ear pain. Symptoms are severe. No associated fever or chills. She's been using over-the-counter Robitussin, Tylenol Cold, and other remedies with no relief. No known exacerbating factors. No other complaints at this time.  Social Hx   Social History   Social History  . Marital Status: Single    Spouse Name: N/A  . Number of Children: N/A  . Years of Education: N/A   Social History Main Topics  . Smoking status: Current Every Day Smoker -- 1.00 packs/day    Types: Cigarettes  . Smokeless tobacco: Never Used  . Alcohol Use: 0.0 oz/week    0 Standard drinks or equivalent per week     Comment: Rare occasions   . Drug Use: No  . Sexual Activity:    Partners: Male     Comment: Husband    Other Topics Concern  . None   Social History Narrative   Lives with husband    Pets 4 dogs- Inside/outside dogs    No children   Jobs is a Herbalist and pet sitting    Enjoys hanging with friends and traveling    Review of Systems  Constitutional: Negative for fever and chills.  HENT: Positive for congestion and ear pain.   Respiratory: Positive for cough.    Objective:  BP 126/82 mmHg  Pulse 104  Temp(Src) 97.6 F (36.4 C) (Oral)  Ht 5\' 5"  (1.651 m)  Wt 234 lb 4 oz (106.255 kg)  BMI 38.98 kg/m2  SpO2 97%  BP/Weight 08/02/2015 11/25/2014 [id number]  Systolic BP 123XX123 [id number]  Diastolic BP 82 90 80  Wt. (Lbs) 234.25 253.2 251.2  BMI 38.98 42.13 41.8   Physical Exam  Constitutional: She is oriented to person, place, and time. She appears well-developed. No distress.  HENT:  Mouth/Throat: Oropharynx is clear and moist.  R TM with erythema, bulging and purulence.  Eyes:  Conjunctivae are normal. No scleral icterus.  Cardiovascular: Normal rate and regular rhythm.   Pulmonary/Chest: Effort normal.  Diffuse wheezing.  Neurological: She is alert and oriented to person, place, and time.  Psychiatric: She has a normal mood and affect.  Vitals reviewed.  Lab Results  Component Value Date   WBC 11.4 05/09/2015   HGB 16.0 05/09/2015   HCT 48* 05/09/2015   PLT 458* 05/09/2015   GLUCOSE 107* 07/06/2014   CHOL 185 07/06/2014   TRIG 192.0* 07/06/2014   HDL 34.70* 07/06/2014   LDLCALC 112* 07/06/2014   ALT 18 07/06/2014   AST 17 07/06/2014   NA 139 05/09/2015   K 3.9 05/09/2015   CL 102 07/06/2014   CREATININE 0.9 05/09/2015   BUN 18 07/06/2014   CO2 28 07/06/2014   TSH 1.22 05/09/2015   HGBA1C 6.3 07/06/2014    Assessment & Plan:   Problem List Items Addressed This Visit    Cough    New problem. Smoker. Wheezing on exam and having significant cough. Treating with Prednisone and Tussionex.      Acute otitis media - Primary    New acute problem. Treating with Augmentin.      Relevant Medications   amoxicillin-clavulanate (AUGMENTIN) 875-125 MG tablet     Meds ordered this  encounter  Medications  . amoxicillin-clavulanate (AUGMENTIN) 875-125 MG tablet    Sig: Take 1 tablet by mouth 2 (two) times daily.    Dispense:  20 tablet    Refill:  0  . predniSONE (DELTASONE) 50 MG tablet    Sig: 1 tablet daily x 5 days.    Dispense:  5 tablet    Refill:  0  . chlorpheniramine-HYDROcodone (TUSSIONEX PENNKINETIC ER) 10-8 MG/5ML SUER    Sig: Take 5 mLs by mouth every 12 (twelve) hours as needed.    Dispense:  115 mL    Refill:  0   Follow-up: PRN  [place]

## 2015-08-02 NOTE — Progress Notes (Signed)
Pre visit review using our clinic review tool, if applicable. No additional management support is needed unless otherwise documented below in the visit note. 

## 2015-08-02 NOTE — Patient Instructions (Signed)
Take the medication as prescribed.  Follow up annually or sooner if needed.  Take care  Dr. Lacinda Axon   Otitis Media, Adult Otitis media is redness, soreness, and inflammation of the middle ear. Otitis media may be caused by allergies or, most commonly, by infection. Often it occurs as a complication of the common cold. SIGNS AND SYMPTOMS Symptoms of otitis media may include:  Earache.  Fever.  Ringing in your ear.  Headache.  Leakage of fluid from the ear. DIAGNOSIS To diagnose otitis media, your health care provider will examine your ear with an otoscope. This is an instrument that allows your health care provider to see into your ear in order to examine your eardrum. Your health care provider also will ask you questions about your symptoms. TREATMENT  Typically, otitis media resolves on its own within 3-5 days. Your health care provider may prescribe medicine to ease your symptoms of pain. If otitis media does not resolve within 5 days or is recurrent, your health care provider may prescribe antibiotic medicines if he or she suspects that a bacterial infection is the cause. HOME CARE INSTRUCTIONS   If you were prescribed an antibiotic medicine, finish it all even if you start to feel better.  Take medicines only as directed by your health care provider.  Keep all follow-up visits as directed by your health care provider. SEEK MEDICAL CARE IF:  You have otitis media only in one ear, or bleeding from your nose, or both.  You notice a lump on your neck.  You are not getting better in 3-5 days.  You feel worse instead of better. SEEK IMMEDIATE MEDICAL CARE IF:   You have pain that is not controlled with medicine.  You have swelling, redness, or pain around your ear or stiffness in your neck.  You notice that part of your face is paralyzed.  You notice that the bone behind your ear (mastoid) is tender when you touch it. MAKE SURE YOU:   Understand these  instructions.  Will watch your condition.  Will get help right away if you are not doing well or get worse.   This information is not intended to replace advice given to you by your health care provider. Make sure you discuss any questions you have with your health care provider.   Document Released: 11/24/2003 Document Revised: 03/11/2014 Document Reviewed: 09/15/2012 Elsevier Interactive Patient Education [employer].

## 2015-08-02 NOTE — Assessment & Plan Note (Signed)
New problem. Smoker. Wheezing on exam and having significant cough. Treating with Prednisone and Tussionex.

## 2015-09-21 ENCOUNTER — Other Ambulatory Visit: Payer: Self-pay | Admitting: Nurse Practitioner

## 2015-12-15 ENCOUNTER — Other Ambulatory Visit: Payer: Self-pay | Admitting: Family Medicine

## 2016-05-18 IMAGING — CR DG CERVICAL SPINE COMPLETE 4+V
1 series · 6 of 6 positions shown · non-contrast
Comparison: None.

CLINICAL DATA: Right-sided neck pain

EXAM:
CERVICAL SPINE  4+ VIEWS

[Series 1: dg cervical spine complete · 0.14mm/px · 6 of 6 slices shown]
[im 1/6]
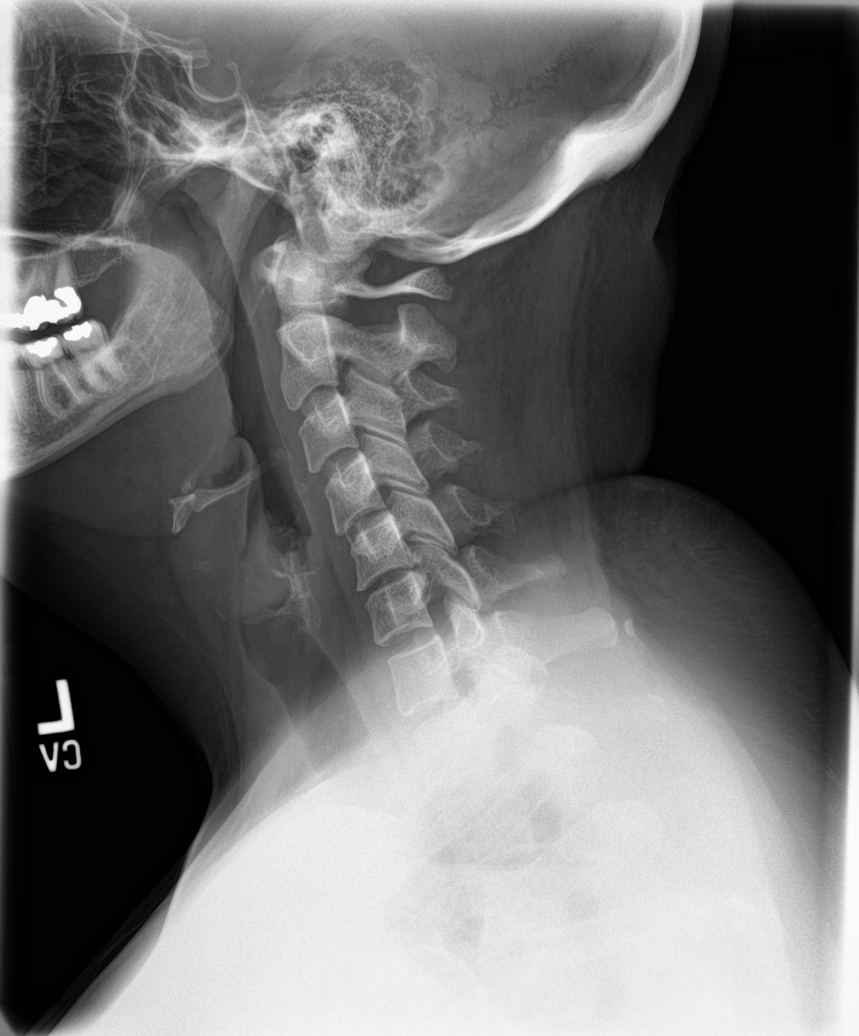
[im 2/6]
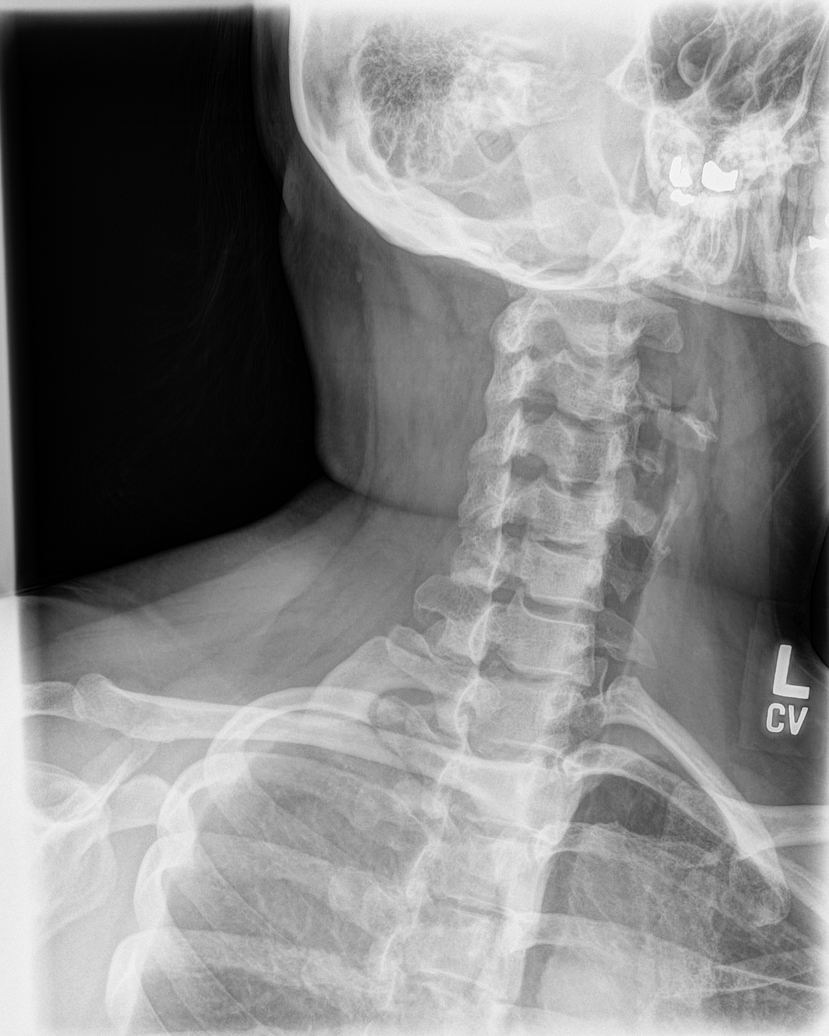
[im 3/6]
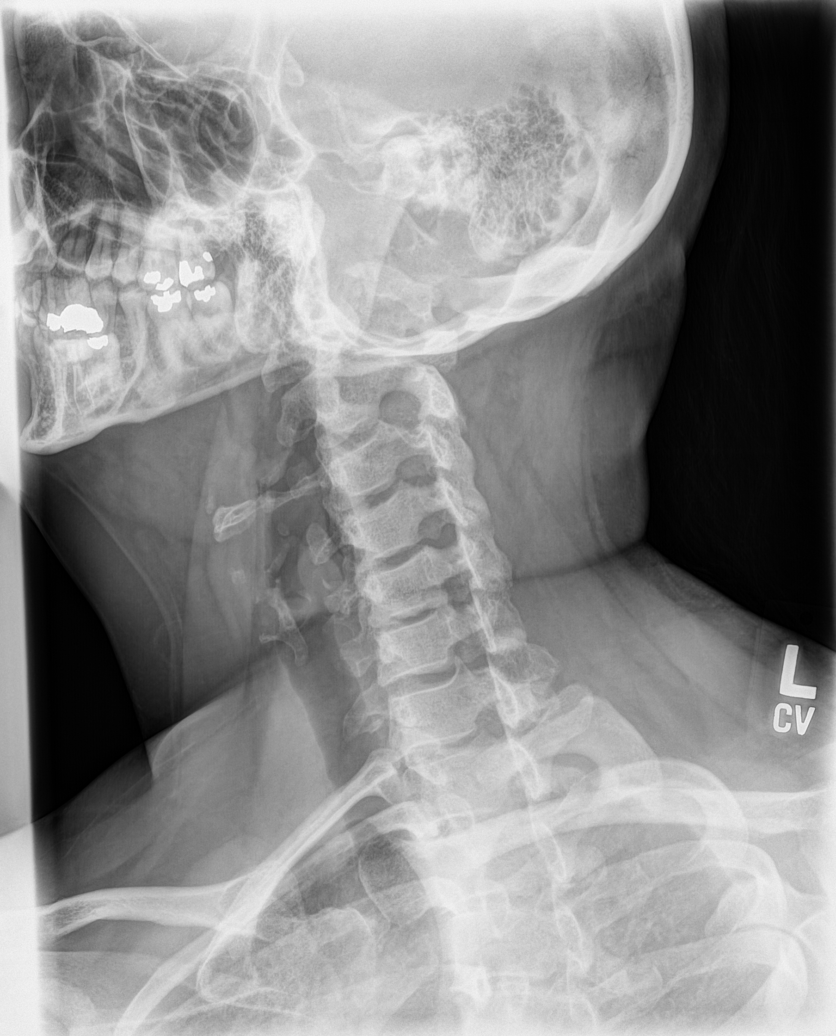
[im 4/6]
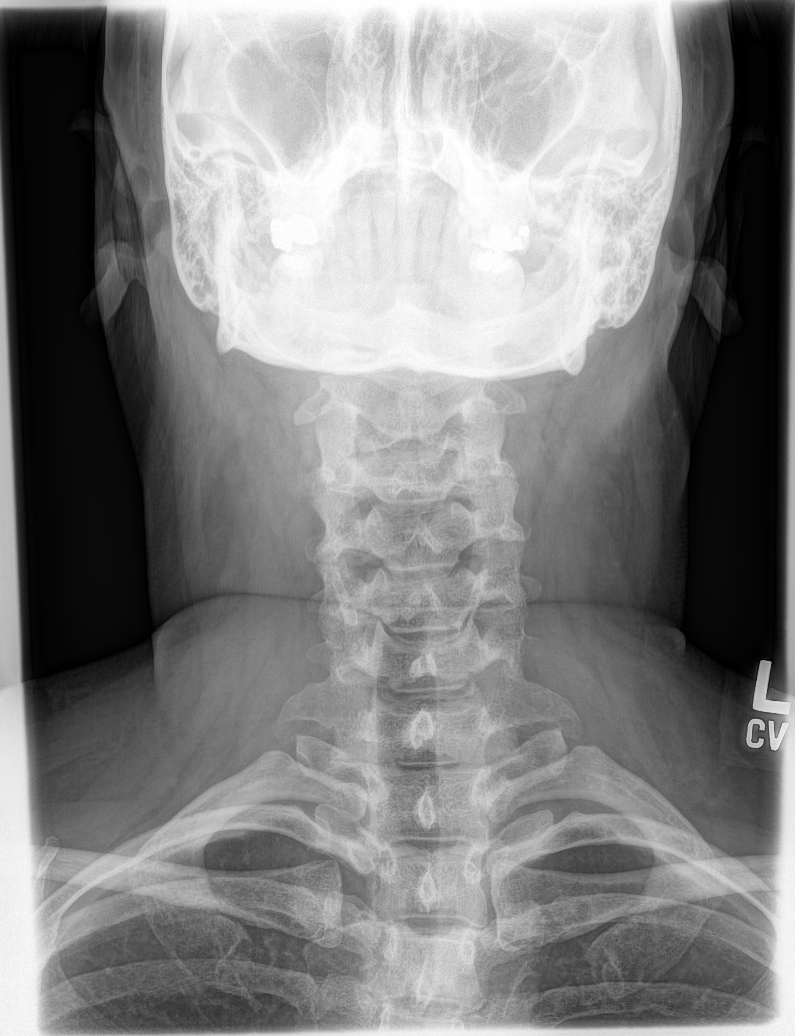
[im 5/6]
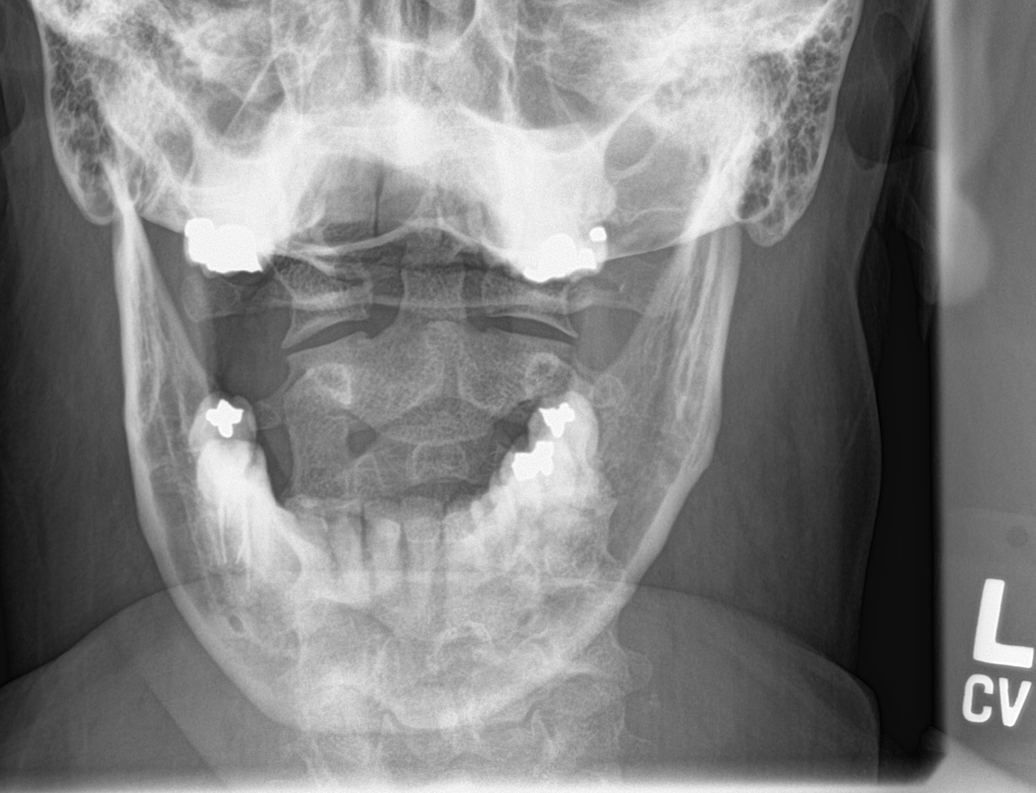
[im 6/6]
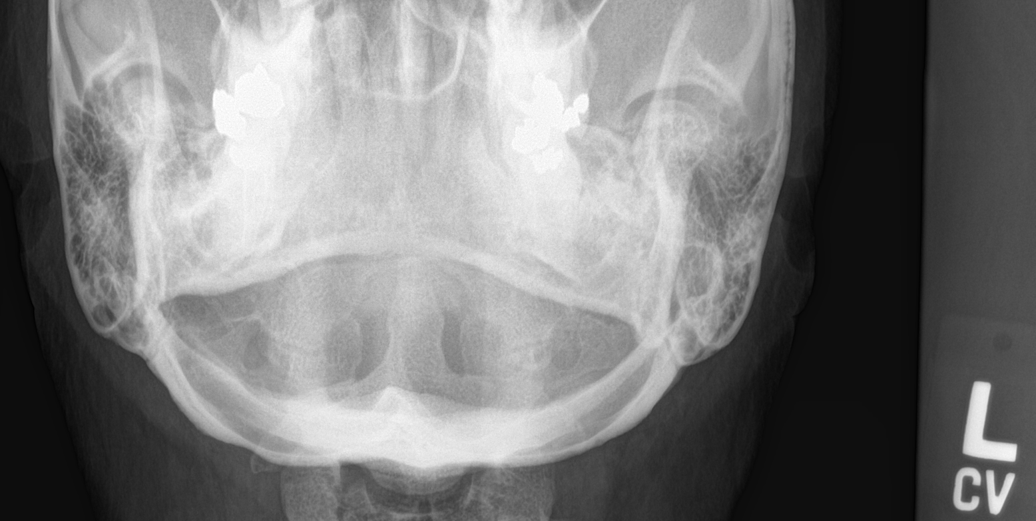

[6 of 6 positions shown; findings below may reference images not displayed]

FINDINGS: Seven cervical segments are well visualized. Vertebral body height
is well maintained. Mild disc space narrowing with osteophytic
changes are noted at C5-6. No significant neural foraminal narrowing
is seen. No acute fracture or acute facet abnormality is noted. The
odontoid is within normal limits. No prevertebral soft tissue
swelling is seen.
IMPRESSION: No acute abnormality noted.

## 2016-05-18 IMAGING — CR DG SHOULDER 2+V*R*
1 series · 3 of 3 positions shown · non-contrast
Comparison: None.

CLINICAL DATA: Right shoulder pain for 2 weeks with tingling in the
right hand. No known injury. Initial encounter.

EXAM:
RIGHT SHOULDER - 2+ VIEW

[Series 1: dg shoulder right · 0.14mm/px · 3 of 3 slices shown]
[im 1/3]
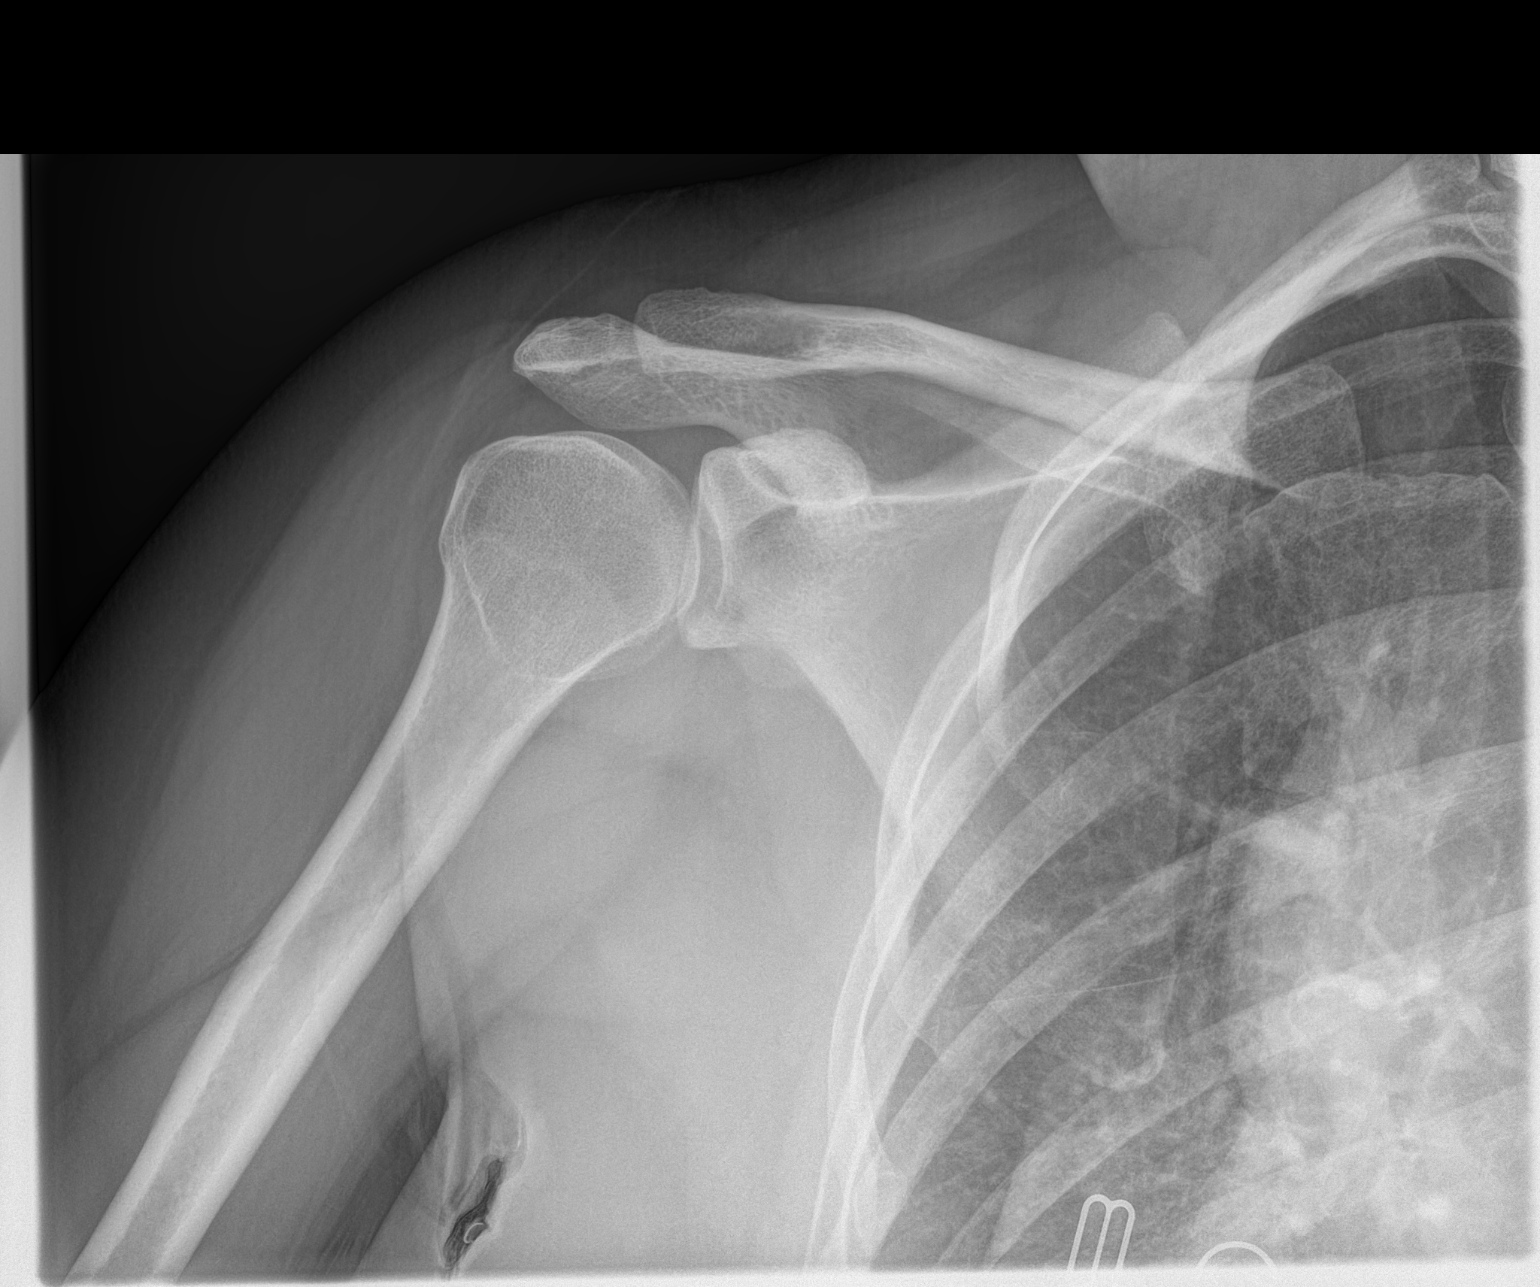
[im 2/3]
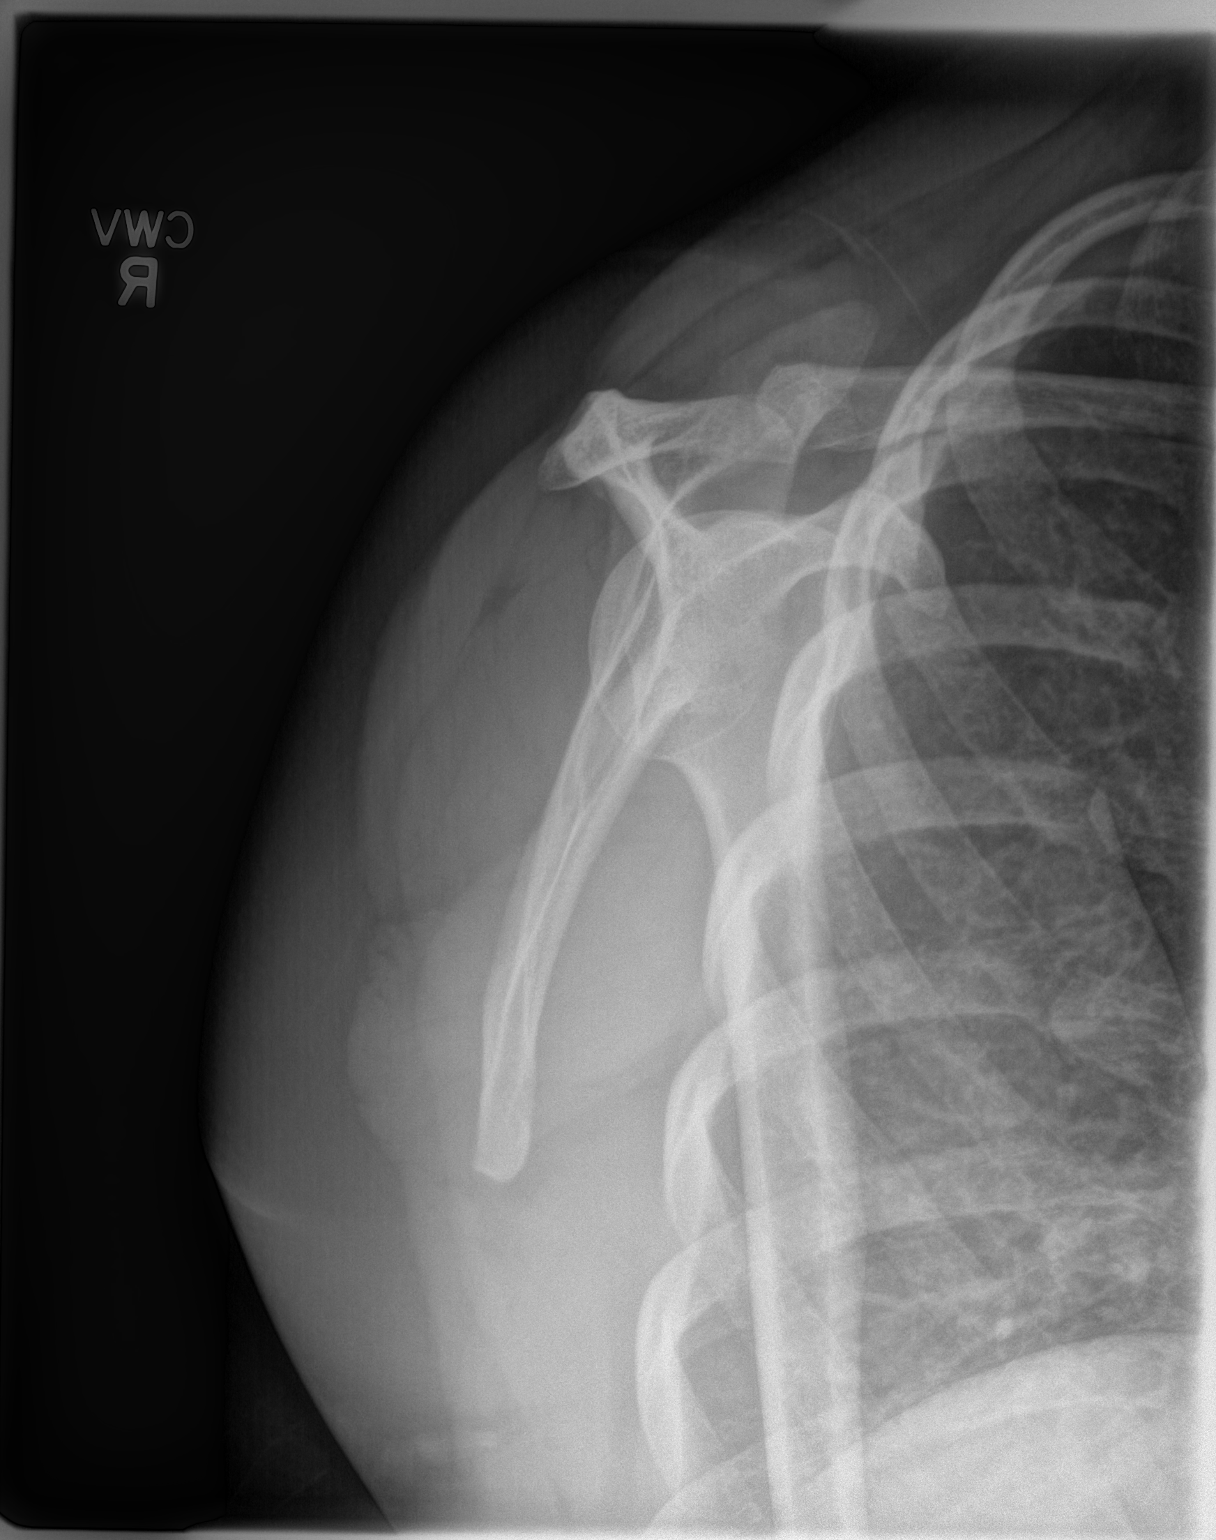
[im 3/3]
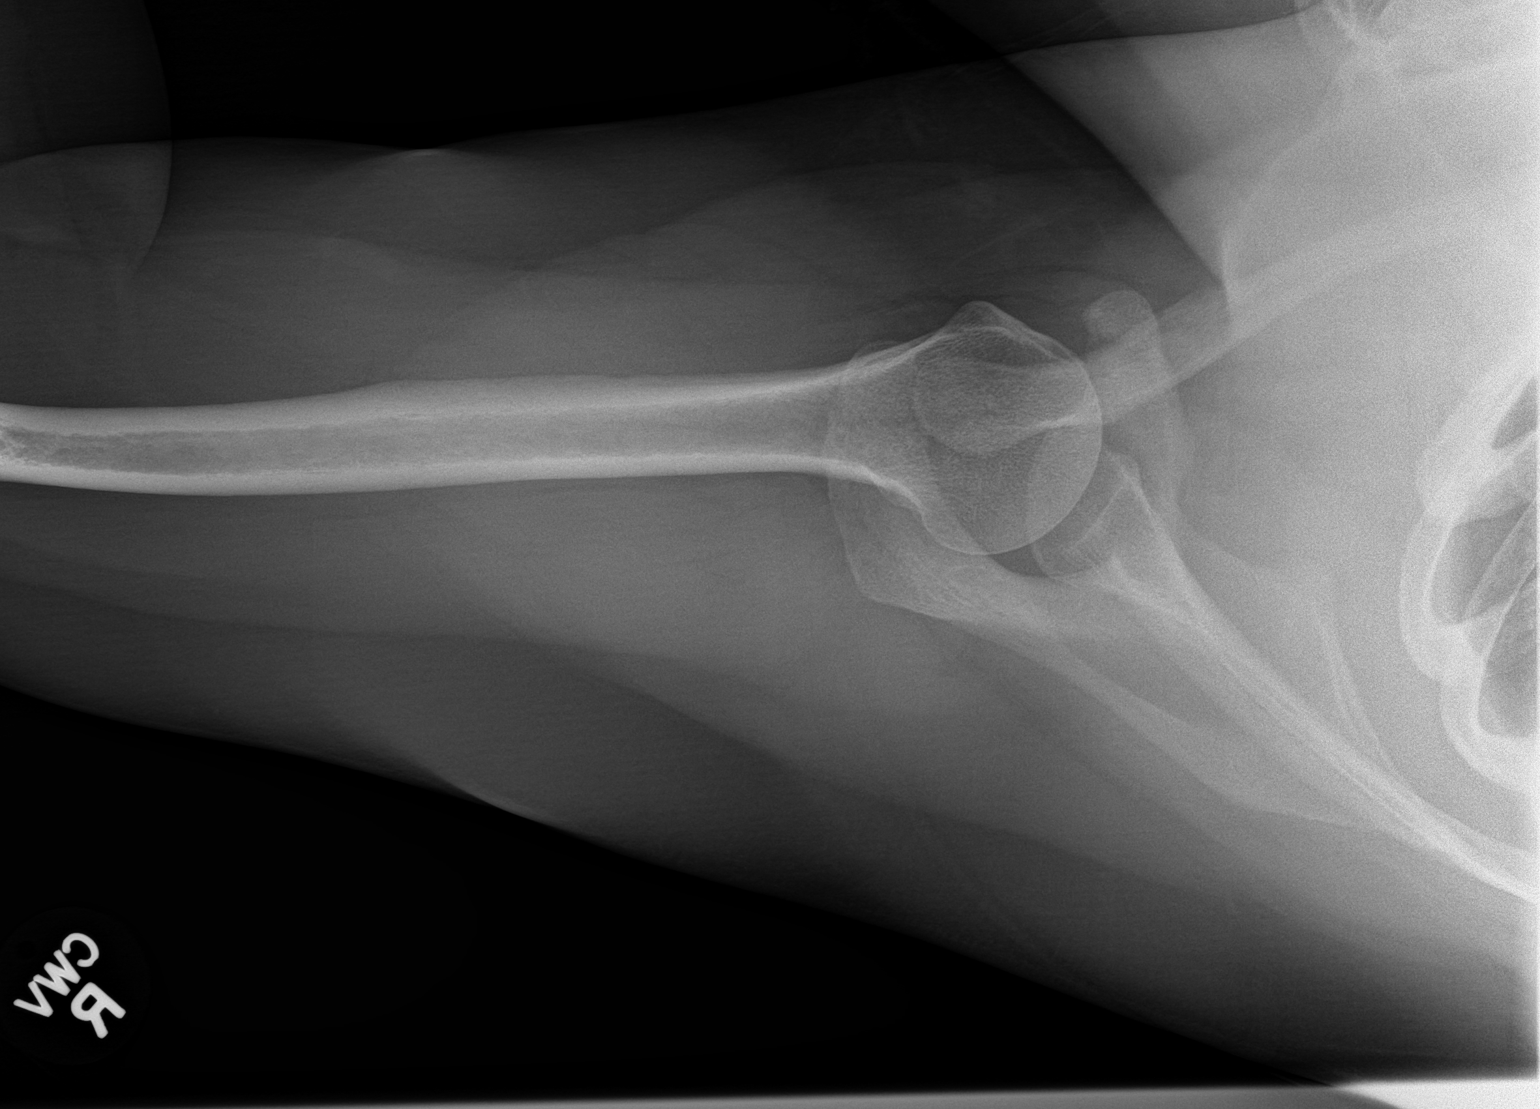

[3 of 3 positions shown; findings below may reference images not displayed]

FINDINGS: There is no evidence of fracture or dislocation. There is no
evidence of arthropathy or other focal bone abnormality. Soft
tissues are unremarkable.
IMPRESSION: Normal exam.

## 2016-08-30 ENCOUNTER — Encounter: Payer: Self-pay | Admitting: Family Medicine

## 2016-08-30 ENCOUNTER — Ambulatory Visit (INDEPENDENT_AMBULATORY_CARE_PROVIDER_SITE_OTHER): Payer: BC Managed Care – PPO | Admitting: Family Medicine

## 2016-08-30 ENCOUNTER — Ambulatory Visit (INDEPENDENT_AMBULATORY_CARE_PROVIDER_SITE_OTHER): Payer: BC Managed Care – PPO

## 2016-08-30 VITALS — BP 114/86 | HR 87 | Temp 98.9°F | Resp 16 | Ht 65.0 in | Wt 247.8 lb

## 2016-08-30 DIAGNOSIS — M25562 Pain in left knee: Secondary | ICD-10-CM

## 2016-08-30 DIAGNOSIS — G8929 Other chronic pain: Secondary | ICD-10-CM

## 2016-08-30 MED ORDER — DICLOFENAC SODIUM 75 MG PO TBEC: 75.0000 mg | DELAYED_RELEASE_TABLET | Freq: Two times a day (BID) | ORAL | 1 refills | Status: DC | PRN

## 2016-08-30 NOTE — Patient Instructions (Signed)
Cough medication as needed.  We will call with the xray results.  Take care  Dr. Lacinda Axon

## 2016-09-01 DIAGNOSIS — G8929 Other chronic pain: Secondary | ICD-10-CM | POA: Insufficient documentation

## 2016-09-01 DIAGNOSIS — M25562 Pain in left knee: Principal | ICD-10-CM

## 2016-09-01 NOTE — Assessment & Plan Note (Signed)
New problem. Acute on chronic.  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 X-ray today. Trial of Voltaren. If persists will need to see ortho.

## 2016-09-01 NOTE — Progress Notes (Signed)
Subjective:  Patient ID: Crystal Leonard, female    DOB: Oct 22, 1970  Age: 46 y.o. MRN: [id number]  CC: Knee pain  HPI:  46 year old female with morbid obesity, hypertension, tobacco abuse presents with complaints of knee pain.  Patient reports that she has chronic knee pain. This was recently worsened after she was told abruptly by her dog while they were walking. She states that she felt a tearing sensation when the dog abruptly pulled her. Pain is of the left knee. Described as sharp and throbbing. She has right knee pain as well but the left is greater. She states that the pain is predominantly behind her patella. No joint line tenderness. No redness or swelling that she knows of. Exacerbated by activity. No known relieving factors. No other associated symptoms. No other complaints at this time.  Social Hx   Social History   Social History  . Marital status: Single    Spouse name: N/A  . Number of children: N/A  . Years of education: N/A   Social History Main Topics  . Smoking status: Current Every Day Smoker    Packs/day: 1.00    Types: Cigarettes  . Smokeless tobacco: Never Used  . Alcohol use 0.0 oz/week     Comment: Rare occasions   . Drug use: No  . Sexual activity: Yes    Partners: Male     Comment: Husband    Other Topics Concern  . None   Social History Narrative   Lives with husband    Pets 4 dogs- Inside/outside dogs    No children   Jobs is a Herbalist and pet sitting    Enjoys hanging with friends and traveling    Review of Systems  Constitutional: Negative.   Musculoskeletal:       Knee pain (L>R).   Objective:  BP 114/86   Pulse 87   Temp 98.9 F (37.2 C) (Oral)   Resp 16   Ht 5\' 5"  (1.651 m)   Wt 247 lb 12.8 oz (112.4 kg)   SpO2 98%   BMI 41.24 kg/m   BP/Weight 08/30/2016 08/02/2015 9/50/9326  Systolic BP 712 458 099  Diastolic BP 86 82 90  Wt. (Lbs) 247.8 234.25 253.2  BMI 41.24 38.98 42.13   Physical Exam  Constitutional:  She is oriented to person, place, and time.  Morbidly obese female in no acute distress.  HENT:  Head: Normocephalic and atraumatic.  Pulmonary/Chest: Effort normal. No respiratory distress.  Musculoskeletal:  Left knee -  Normal to inspection with no erythema or effusion or obvious bony abnormalities.  Palpation - with tenderness with palpation of the patella. No joint line tenderness. Ligaments with solid consistent endpoints including ACL, PCL, LCL, MCL. Painful patellar compression. Patellar glide with crepitus.   Neurological: She is alert and oriented to person, place, and time.  Psychiatric: She has a normal mood and affect.  Vitals reviewed.  Lab Results  Component Value Date   WBC 11.4 05/09/2015   HGB 16.0 05/09/2015   HCT 48 (A) 05/09/2015   PLT 458 (A) 05/09/2015   GLUCOSE 107 (H) 07/06/2014   CHOL 185 07/06/2014   TRIG 192.0 (H) 07/06/2014   HDL 34.70 (L) 07/06/2014   LDLCALC 112 (H) 07/06/2014   ALT 18 07/06/2014   AST 17 07/06/2014   NA 139 05/09/2015   K 3.9 05/09/2015   CL 102 07/06/2014   CREATININE 0.9 05/09/2015   BUN 18 07/06/2014   CO2 28 07/06/2014  TSH 1.22 05/09/2015   HGBA1C 6.3 07/06/2014    Assessment & Plan:   Problem List Items Addressed This Visit      Other   Chronic pain of left knee - Primary    New problem. Acute on chronic.  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 X-ray today. Trial of Voltaren. If persists will need to see ortho.       Relevant Medications   diclofenac (VOLTAREN) 75 MG EC tablet   Other Relevant Orders   DG Knee Complete 4 Views Left (Completed)     Meds ordered this encounter  Medications  . diclofenac (VOLTAREN) 75 MG EC tablet    Sig: Take 1 tablet (75 mg total) by mouth 2 (two) times daily as needed.    Dispense:  60 tablet    Refill:  1   Follow-up: PRN  [place]

## 2016-09-02 ENCOUNTER — Telehealth: Payer: Self-pay | Admitting: Family Medicine

## 2016-09-02 NOTE — Telephone Encounter (Signed)
Pt called to get her x ray results from Friday.   Call pt @ [phone]. Thank you!

## 2016-09-02 NOTE — Telephone Encounter (Signed)
Called patient back in regards in lab results

## 2016-09-16 ENCOUNTER — Other Ambulatory Visit: Payer: Self-pay | Admitting: Family Medicine

## 2016-09-16 NOTE — Telephone Encounter (Signed)
Last refilled 06/19/16 Previously filled by Lorane Gell, FNP Seen acutely 08/30/16 No office visit scheduled Last refill stated needs office vist

## 2016-11-25 ENCOUNTER — Encounter: Payer: Self-pay | Admitting: Family

## 2016-11-25 ENCOUNTER — Telehealth: Payer: Self-pay | Admitting: Family

## 2016-11-25 ENCOUNTER — Ambulatory Visit (INDEPENDENT_AMBULATORY_CARE_PROVIDER_SITE_OTHER): Payer: BC Managed Care – PPO | Admitting: Family

## 2016-11-25 VITALS — BP 126/80 | HR 95 | Temp 98.1°F | Ht 65.0 in | Wt 248.0 lb

## 2016-11-25 DIAGNOSIS — R05 Cough: Secondary | ICD-10-CM

## 2016-11-25 DIAGNOSIS — R059 Cough, unspecified: Secondary | ICD-10-CM

## 2016-11-25 MED ORDER — ALBUTEROL SULFATE HFA 108 (90 BASE) MCG/ACT IN AERS: 2.0000 | INHALATION_SPRAY | Freq: Four times a day (QID) | RESPIRATORY_TRACT | 1 refills | Status: DC | PRN

## 2016-11-25 MED ORDER — HYDROCODONE-HOMATROPINE 5-1.5 MG/5ML PO SYRP: 5.0000 mL | ORAL_SOLUTION | Freq: Every evening | ORAL | 0 refills | Status: DC | PRN

## 2016-11-25 MED ORDER — ALBUTEROL SULFATE (2.5 MG/3ML) 0.083% IN NEBU: 2.5000 mg | INHALATION_SOLUTION | Freq: Once | RESPIRATORY_TRACT | Status: DC

## 2016-11-25 MED ORDER — BENZONATATE 100 MG PO CAPS: 100.0000 mg | ORAL_CAPSULE | Freq: Two times a day (BID) | ORAL | 1 refills | Status: DC | PRN

## 2016-11-25 NOTE — Progress Notes (Signed)
Subjective:    Patient ID: Crystal Leonard, female    DOB: [birth year], 46 y.o.   MRN: [id number]  CC: Crystal Leonard is a 46 y.o. female who presents today for an acute visit.    HPI: CC: cough x 5 days, worsening past couple of days.  Cough worse at night.  Tried tylenol cold,mucinex without relief  Endorses tactile fever, chills 2 days ago- resolved.  Endorses ear pressure, nasal congestion, wheezing ( 'some').  No SOB, leg swelling.   Smoker-' every once in a while. ' Stopped smoking 5 days ago when cough started.      HISTORY:  Past Medical History:  Diagnosis Date  . Chicken pox   . Depression   . Headache    Frequent headaches  . Hypertension   . Kidney stones   . Migraine   . UTI (lower urinary tract infection)    Past Surgical History:  Procedure Laterality Date  . ABDOMINAL HYSTERECTOMY  2012  . LAPAROSCOPIC ENDOMETRIOSIS FULGURATION    . TONSILLECTOMY AND ADENOIDECTOMY     Family History  Problem Relation Age of Onset  . Hypertension Mother   . Cancer Father        prostate  . Hypertension Maternal Grandmother   . Diabetes Brother   . Diabetes Maternal Aunt     Allergies: Tetanus toxoids Current Outpatient Prescriptions on File Prior to Visit  Medication Sig Dispense Refill  . citalopram (CELEXA) 20 MG tablet Take 20 mg by mouth daily.    . diclofenac (VOLTAREN) 75 MG EC tablet Take 1 tablet (75 mg total) by mouth 2 (two) times daily as needed. 60 tablet 1  . DULoxetine (CYMBALTA) 60 MG capsule Take 60 mg by mouth daily.    . hydrochlorothiazide (HYDRODIURIL) 12.5 MG tablet TAKE 1 TABLET (12.5 MG TOTAL) BY MOUTH DAILY. 90 tablet 0  . hydrochlorothiazide (HYDRODIURIL) 12.5 MG tablet TAKE 1 TABLET (12.5 MG TOTAL) BY MOUTH DAILY. NEED OV FOR FURTHER REFILLS 90 tablet 2  . Multiple Vitamin (MULTIVITAMIN) tablet Take 1 tablet by mouth daily.    Marland Kitchen omeprazole (PRILOSEC) 20 MG capsule Take 20 mg by mouth as needed.     No current facility-administered  medications on file prior to visit.     Social History  Substance Use Topics  . Smoking status: Current Every Day Smoker    Packs/day: 1.00    Types: Cigarettes  . Smokeless tobacco: Never Used  . Alcohol use 0.0 oz/week     Comment: Rare occasions     Review of Systems  Constitutional: Negative for chills and fever.  HENT: Negative for sinus pressure and sore throat.   Respiratory: Positive for cough and wheezing. Negative for shortness of breath.   Cardiovascular: Negative for chest pain and palpitations.  Gastrointestinal: Negative for nausea and vomiting.      Objective:    BP 126/80   Pulse 95   Temp 98.1 F (36.7 C) (Oral)   Ht 5\' 5"  (1.651 m)   Wt 248 lb (112.5 kg)   SpO2 98%   BMI 41.27 kg/m    Physical Exam  Constitutional: She appears well-developed and well-nourished.  HENT:  Head: Normocephalic and atraumatic.  Right Ear: Hearing, tympanic membrane, external ear and ear canal normal. No drainage, swelling or tenderness. No foreign bodies. Tympanic membrane is not erythematous and not bulging. No middle ear effusion. No decreased hearing is noted.  Left Ear: Hearing, tympanic membrane, external ear and ear canal normal.  No drainage, swelling or tenderness. No foreign bodies. Tympanic membrane is not erythematous and not bulging.  No middle ear effusion. No decreased hearing is noted.  Nose: Nose normal. No rhinorrhea. Right sinus exhibits no maxillary sinus tenderness and no frontal sinus tenderness. Left sinus exhibits no maxillary sinus tenderness and no frontal sinus tenderness.  Mouth/Throat: Uvula is midline, oropharynx is clear and moist and mucous membranes are normal. No oropharyngeal exudate, posterior oropharyngeal edema, posterior oropharyngeal erythema or tonsillar abscesses.  Eyes: Conjunctivae are normal.  Cardiovascular: Regular rhythm, normal heart sounds and normal pulses.   Pulmonary/Chest: Effort normal and breath sounds normal. She has no  wheezes. She has no rhonchi. She has no rales.  Lymphadenopathy:       Head (right side): No submental, no submandibular, no tonsillar, no preauricular, no posterior auricular and no occipital adenopathy present.       Head (left side): No submental, no submandibular, no tonsillar, no preauricular, no posterior auricular and no occipital adenopathy present.    She has no cervical adenopathy.  Neurological: She is alert.  Skin: Skin is warm and dry.  Psychiatric: She has a normal mood and affect. Her speech is normal and behavior is normal. Thought content normal.  Vitals reviewed.  Patient felt significantly better after albuterol treatment. Lung sounds clear and increased     Assessment & Plan:   1. Cough Working diagnosis of viral URI x 5 days, Likely exacerbated by current status of smoking. Sa02 98. No acute respiratory distress. Afebrile. Improved with nebulizer. Patient and I agreed upon conservative therapy at this time with symptom management, close observation, and delayed antibiotic treatment.  - albuterol (PROVENTIL) (2.5 MG/3ML) 0.083% nebulizer solution 2.5 mg; Take 3 mLs (2.5 mg total) by nebulization once. - HYDROcodone-homatropine (HYCODAN) 5-1.5 MG/5ML syrup; Take 5 mLs by mouth at bedtime as needed for cough.  Dispense: 30 mL; Refill: 0 - albuterol (PROVENTIL HFA) 108 (90 Base) MCG/ACT inhaler; Inhale 2 puffs into the lungs every 6 (six) hours as needed for wheezing or shortness of breath.  Dispense: 1 Inhaler; Refill: 1 - benzonatate (TESSALON) 100 MG capsule; Take 1 capsule (100 mg total) by mouth 2 (two) times daily as needed for cough.  Dispense: 30 capsule; Refill: 1    I am having [name] maintain her omeprazole, multivitamin, hydrochlorothiazide, citalopram, DULoxetine, diclofenac, and hydrochlorothiazide.   No orders of the defined types were placed in this encounter.   Return precautions given.   Risks, benefits, and alternatives of the medications and  treatment plan prescribed today were discussed, and patient expressed understanding.   Education regarding symptom management and diagnosis given to patient on AVS.  Continue to follow with Coral Spikes, DO for routine health maintenance.   Crystal Leonard and I agreed with plan.   Mable Paris, FNP

## 2016-11-25 NOTE — Patient Instructions (Signed)
Please take cough medication at night only as needed. As we discussed, I do not recommend dosing throughout the day as coughing is a protective mechanism . It also helps to break up thick mucous.  Do not take cough suppressants with alcohol as can lead to trouble breathing. Advise caution if taking cough suppressant and operating machinery ( i.e driving a car) as you may feel very tired.    Tessalon better for day  Hycodan better at night  Use albuterol every 6 hours for first 24 hours to get good medication into the lungs and loosen congestion; after, you may use as needed and eventually stop all together when cough resolves.  Let me know if not better

## 2016-11-25 NOTE — Progress Notes (Signed)
Pre visit review using our clinic review tool, if applicable. No additional management support is needed unless otherwise documented below in the visit note. 

## 2016-11-26 NOTE — Telephone Encounter (Signed)
close

## 2016-12-11 ENCOUNTER — Ambulatory Visit: Payer: BC Managed Care – PPO | Admitting: Family

## 2017-05-08 ENCOUNTER — Ambulatory Visit: Payer: Self-pay

## 2017-05-08 NOTE — Telephone Encounter (Signed)
Patient called in with c/o "lower right back pain." She says "this has been going on for a few months, but lately it's getting worse. I have numbness down my right leg to my right big toe and sometimes my right arm. When I sleep at night, I wake up with numbness/tingling to the right arm, leg, toe and this happens about 8 out of 10 days and is more frequent than before. The pain is just in one spot to the right of my lower back and it is dull at a 5-6. The pain is constant, but the numbness and tingling comes and goes." I asked about injury to her back, she says "no injury or heavy lifting." I asked about bowel/bladder problems, she says "I have constipation, but I think it's from the anti-depressant medication I take. It's soft sometimes." According to protocol, see PCP within 24 hours, no availability with PCP, appointment made for tomorrow at 0900 with Dr. Terese Door, care advice given, she verbalized understanding.   Reason for Disposition . Numbness in a leg or foot (i.e., loss of sensation)  Answer Assessment - Initial Assessment Questions 1. ONSET: "When did the pain begin?"      Several months ago 2. LOCATION: "Where does it hurt?" (upper, mid or lower back)     Lower back to the right side 3. SEVERITY: "How bad is the pain?"  (e.g., Scale 1-10; mild, moderate, or severe)   - MILD (1-3): doesn't interfere with normal activities    - MODERATE (4-7): interferes with normal activities or awakens from sleep    - SEVERE (8-10): excruciating pain, unable to do any normal activities      5-6 4. PATTERN: "Is the pain constant?" (e.g., yes, no; constant, intermittent)      Constant to the back in one spot 5. RADIATION: "Does the pain shoot into your legs or elsewhere?"     Dull Pain stays to back, but numbness and tingling to right leg to right great toe and into right arm at times 6. CAUSE:  "What do you think is causing the back pain?"      I'm not sure 7. BACK OVERUSE:  "Any recent  lifting of heavy objects, strenuous work or exercise?"     No 8. MEDICATIONS: "What have you taken so far for the pain?" (e.g., nothing, acetaminophen, NSAIDS)     BC's on occasion 9. NEUROLOGIC SYMPTOMS: "Do you have any weakness, numbness, or problems with bowel/bladder control?"     Numbness to right leg and right arm; constipation at times 10. OTHER SYMPTOMS: "Do you have any other symptoms?" (e.g., fever, abdominal pain, burning with urination, blood in urine)       No 11. PREGNANCY: "Is there any chance you are pregnant?" (e.g., yes, no; LMP)       No  Protocols used: BACK PAIN-A-AH

## 2017-05-09 ENCOUNTER — Encounter: Payer: Self-pay | Admitting: Internal Medicine

## 2017-05-09 ENCOUNTER — Ambulatory Visit: Payer: BC Managed Care – PPO | Admitting: Internal Medicine

## 2017-05-09 ENCOUNTER — Ambulatory Visit (INDEPENDENT_AMBULATORY_CARE_PROVIDER_SITE_OTHER): Payer: BC Managed Care – PPO

## 2017-05-09 VITALS — BP 130/88 | HR 90 | Temp 98.3°F | Ht 60.0 in | Wt 252.8 lb

## 2017-05-09 DIAGNOSIS — Z1329 Encounter for screening for other suspected endocrine disorder: Secondary | ICD-10-CM | POA: Diagnosis not present

## 2017-05-09 DIAGNOSIS — E785 Hyperlipidemia, unspecified: Secondary | ICD-10-CM | POA: Insufficient documentation

## 2017-05-09 DIAGNOSIS — R7303 Prediabetes: Secondary | ICD-10-CM | POA: Insufficient documentation

## 2017-05-09 DIAGNOSIS — M542 Cervicalgia: Secondary | ICD-10-CM | POA: Insufficient documentation

## 2017-05-09 DIAGNOSIS — C4490 Unspecified malignant neoplasm of skin, unspecified: Secondary | ICD-10-CM | POA: Insufficient documentation

## 2017-05-09 DIAGNOSIS — I10 Essential (primary) hypertension: Secondary | ICD-10-CM | POA: Diagnosis not present

## 2017-05-09 DIAGNOSIS — N200 Calculus of kidney: Secondary | ICD-10-CM

## 2017-05-09 DIAGNOSIS — G8929 Other chronic pain: Secondary | ICD-10-CM | POA: Diagnosis not present

## 2017-05-09 DIAGNOSIS — M5441 Lumbago with sciatica, right side: Secondary | ICD-10-CM | POA: Diagnosis not present

## 2017-05-09 DIAGNOSIS — E559 Vitamin D deficiency, unspecified: Secondary | ICD-10-CM

## 2017-05-09 LAB — CBC WITH DIFFERENTIAL/PLATELET
Basophils Absolute: 0.1 10*3/uL (ref 0.0–0.1)
Basophils Relative: 0.7 % (ref 0.0–3.0)
Eosinophils Absolute: 0.1 10*3/uL (ref 0.0–0.7)
Eosinophils Relative: 1.5 % (ref 0.0–5.0)
HCT: 44.5 % (ref 36.0–46.0)
Hemoglobin: 15.1 g/dL — ABNORMAL HIGH (ref 12.0–15.0)
Lymphocytes Relative: 33.6 % (ref 12.0–46.0)
Lymphs Abs: 3 10*3/uL (ref 0.7–4.0)
MCHC: 33.9 g/dL (ref 30.0–36.0)
MCV: 88.6 fl (ref 78.0–100.0)
Monocytes Absolute: 0.4 10*3/uL (ref 0.1–1.0)
Monocytes Relative: 4.9 % (ref 3.0–12.0)
Neutro Abs: 5.2 10*3/uL (ref 1.4–7.7)
Neutrophils Relative %: 59.3 % (ref 43.0–77.0)
Platelets: 433 10*3/uL — ABNORMAL HIGH (ref 150.0–400.0)
RBC: 5.02 Mil/uL (ref 3.87–5.11)
RDW: 14.1 % (ref 11.5–15.5)
WBC: 8.8 10*3/uL (ref 4.0–10.5)

## 2017-05-09 LAB — COMPREHENSIVE METABOLIC PANEL
ALT: 15 U/L (ref 0–35)
AST: 15 U/L (ref 0–37)
Albumin: 4.4 g/dL (ref 3.5–5.2)
Alkaline Phosphatase: 76 U/L (ref 39–117)
BUN: 16 mg/dL (ref 6–23)
CO2: 30 mEq/L (ref 19–32)
Calcium: 10.2 mg/dL (ref 8.4–10.5)
Chloride: 102 mEq/L (ref 96–112)
Creatinine, Ser: 0.86 mg/dL (ref 0.40–1.20)
GFR: 75.76 mL/min (ref >60.00)
Glucose, Bld: 102 mg/dL — ABNORMAL HIGH (ref 70–99)
Potassium: 4.1 mEq/L (ref 3.5–5.1)
Sodium: 139 mEq/L (ref 135–145)
Total Bilirubin: 0.4 mg/dL (ref 0.2–1.2)
Total Protein: 7.6 g/dL (ref 6.0–8.3)

## 2017-05-09 LAB — POCT URINALYSIS DIPSTICK
Bilirubin, UA: NEGATIVE
Glucose, UA: NEGATIVE
Ketones, UA: NEGATIVE
Leukocytes, UA: NEGATIVE
Nitrite, UA: NEGATIVE
Spec Grav, UA: 1.02 (ref 1.010–1.025)
Urobilinogen, UA: 0.2 E.U./dL
pH, UA: 6.5 (ref 5.0–8.0)

## 2017-05-09 LAB — URINALYSIS, ROUTINE W REFLEX MICROSCOPIC
Bilirubin Urine: NEGATIVE
Ketones, ur: NEGATIVE
Leukocytes, UA: NEGATIVE
Nitrite: NEGATIVE
Specific Gravity, Urine: 1.02 (ref 1.000–1.030)
Total Protein, Urine: NEGATIVE
Urine Glucose: NEGATIVE
Urobilinogen, UA: 0.2 (ref 0.0–1.0)
WBC, UA: NONE SEEN (ref 0–?)
pH: 7 (ref 5.0–8.0)

## 2017-05-09 LAB — LIPID PANEL
Cholesterol: 190 mg/dL (ref 0–200)
HDL: 41.5 mg/dL (ref >39.00)
LDL Cholesterol: 120 mg/dL — ABNORMAL HIGH (ref 0–99)
NonHDL: 148.14
Total CHOL/HDL Ratio: 5
Triglycerides: 143 mg/dL (ref 0.0–149.0)
VLDL: 28.6 mg/dL (ref 0.0–40.0)

## 2017-05-09 LAB — HEMOGLOBIN A1C: Hgb A1c MFr Bld: 6.4 % (ref 4.6–6.5)

## 2017-05-09 LAB — VITAMIN D 25 HYDROXY (VIT D DEFICIENCY, FRACTURES): VITD: 28.39 ng/mL — ABNORMAL LOW (ref 30.00–100.00)

## 2017-05-09 LAB — T4, FREE: Free T4: 0.6 ng/dL (ref 0.60–1.60)

## 2017-05-09 LAB — TSH: TSH: 0.85 u[IU]/mL (ref 0.35–4.50)

## 2017-05-09 MED ORDER — HYDROCHLOROTHIAZIDE 12.5 MG PO TABS: 12.5000 mg | ORAL_TABLET | Freq: Every day | ORAL | 3 refills | Status: DC

## 2017-05-09 NOTE — Progress Notes (Signed)
Chief Complaint  Patient presents with  . Back Pain   Follow up  1. C/o low back pain 3/10 and max 6/10 x 1 year radiating to right back to big toe. C/o numbness down right leg. No bowel bladder incontinece, no leg weakness. She reports bowel movement relieves pain. She does not want to do PT. She tries Yoga in am and tennis balls on her back which help.   2. She also c/o right arm numbness  W/o neck pain  3. HTN controlled hctz 12.5 mg qd    Review of Systems  Constitutional: Negative for weight loss.  HENT: Negative for hearing loss.   Eyes: Negative for blurred vision.  Respiratory: Negative for shortness of breath.   Cardiovascular: Negative for chest pain.  Gastrointestinal: Negative for abdominal pain.  Genitourinary: Negative for dysuria.  Musculoskeletal: Positive for back pain. Negative for neck pain.  Skin: Negative for rash.  Neurological: Positive for sensory change.  Psychiatric/Behavioral: Negative for memory loss.   Past Medical History:  Diagnosis Date  . Chicken pox   . Depression   . Headache    Frequent headaches  . Hypertension   . Kidney stones   . Migraine   . UTI (lower urinary tract infection)    Past Surgical History:  Procedure Laterality Date  . ABDOMINAL HYSTERECTOMY  2012  . LAPAROSCOPIC ENDOMETRIOSIS FULGURATION    . TONSILLECTOMY AND ADENOIDECTOMY     Family History  Problem Relation Age of Onset  . Hypertension Mother   . Cancer Father        prostate  . Hypertension Maternal Grandmother   . Diabetes Brother   . Diabetes Maternal Aunt    Social History   Socioeconomic History  . Marital status: Single    Spouse name: Not on file  . Number of children: Not on file  . Years of education: Not on file  . Highest education level: Not on file  Social Needs  . Financial resource strain: Not on file  . Food insecurity - worry: Not on file  . Food insecurity - inability: Not on file  . Transportation needs - medical: Not on file  .  Transportation needs - non-medical: Not on file  Occupational History  . Not on file  Tobacco Use  . Smoking status: Current Every Day Smoker    Packs/day: 1.00    Types: Cigarettes  . Smokeless tobacco: Never Used  Substance and Sexual Activity  . Alcohol use: Yes    Alcohol/week: 0.0 oz    Comment: Rare occasions   . Drug use: No  . Sexual activity: Yes    Partners: Male    Comment: Husband   Other Topics Concern  . Not on file  Social History Narrative   Lives with husband    Pets 4 dogs- Inside/outside dogs    No children   Jobs is a Herbalist and pet sitting    Enjoys hanging with friends and traveling    Current Meds  Medication Sig  . albuterol (PROVENTIL HFA) 108 (90 Base) MCG/ACT inhaler Inhale 2 puffs into the lungs every 6 (six) hours as needed for wheezing or shortness of breath.  . benzonatate (TESSALON) 100 MG capsule Take 1 capsule (100 mg total) by mouth 2 (two) times daily as needed for cough.  . citalopram (CELEXA) 20 MG tablet Take 20 mg by mouth daily.  . diclofenac (VOLTAREN) 75 MG EC tablet Take 1 tablet (75 mg total) by mouth 2 (  two) times daily as needed.  . DULoxetine (CYMBALTA) 60 MG capsule Take 60 mg by mouth daily.  . hydrochlorothiazide (HYDRODIURIL) 12.5 MG tablet Take 1 tablet (12.5 mg total) by mouth daily.  Marland Kitchen HYDROcodone-homatropine (HYCODAN) 5-1.5 MG/5ML syrup Take 5 mLs by mouth at bedtime as needed for cough.  . Multiple Vitamin (MULTIVITAMIN) tablet Take 1 tablet by mouth daily.  Marland Kitchen omeprazole (PRILOSEC) 20 MG capsule Take 20 mg by mouth as needed.  . [DISCONTINUED] hydrochlorothiazide (HYDRODIURIL) 12.5 MG tablet TAKE 1 TABLET (12.5 MG TOTAL) BY MOUTH DAILY.  . [DISCONTINUED] hydrochlorothiazide (HYDRODIURIL) 12.5 MG tablet TAKE 1 TABLET (12.5 MG TOTAL) BY MOUTH DAILY. NEED OV FOR FURTHER REFILLS   Current Facility-Administered Medications for the 05/09/17 encounter (Office Visit) with McLean-Scocuzza, Nino Glow, MD  Medication  .  albuterol (PROVENTIL) (2.5 MG/3ML) 0.083% nebulizer solution 2.5 mg   Allergies  Allergen Reactions  . Tetanus Toxoids Nausea And Vomiting   Recent Results (from the past 2160 hour(s))  POCT urinalysis dipstick     Status: None   Collection Time: 05/09/17  9:28 AM  Result Value Ref Range   Color, UA yellow    Clarity, UA clear    Glucose, UA neg    Bilirubin, UA neg    Ketones, UA neg    Spec Grav, UA 1.020 1.010 - 1.025   Blood, UA 2+    pH, UA 6.5 5.0 - 8.0   Protein, UA 15mg /dl    Urobilinogen, UA 0.2 0.2 or 1.0 E.U./dL   Nitrite, UA neg    Leukocytes, UA Negative Negative   Appearance     Odor     Objective  Body mass index is 49.37 kg/m. Wt Readings from Last 3 Encounters:  05/09/17 252 lb 12.8 oz (114.7 kg)  11/25/16 248 lb (112.5 kg)  08/30/16 247 lb 12.8 oz (112.4 kg)   Temp Readings from Last 3 Encounters:  05/09/17 98.3 F (36.8 C) (Oral)  11/25/16 98.1 F (36.7 C) (Oral)  08/30/16 98.9 F (37.2 C) (Oral)   BP Readings from Last 3 Encounters:  05/09/17 130/88  11/25/16 126/80  08/30/16 114/86   Pulse Readings from Last 3 Encounters:  05/09/17 90  11/25/16 95  08/30/16 87   O2 sat room air 97%  Physical Exam  Constitutional: She is oriented to person, place, and time and well-developed, well-nourished, and in no distress. Vital signs are normal.  HENT:  Head: Normocephalic and atraumatic.  Mouth/Throat: Oropharynx is clear and moist and mucous membranes are normal.  Eyes: Conjunctivae are normal. Pupils are equal, round, and reactive to light.  Neck: Normal range of motion.  Cardiovascular: Normal rate, regular rhythm and normal heart sounds.  Pulmonary/Chest: Effort normal and breath sounds normal.  Musculoskeletal: She exhibits no tenderness.       Cervical back: She exhibits no tenderness.       Lumbar back: She exhibits no tenderness.  Neg str8 leg test b/l   Neurological: She is alert and oriented to person, place, and time. Gait normal.  Gait normal.  Skin: Skin is warm, dry and intact.  Psychiatric: Mood, memory, affect and judgment normal.  Nursing note and vitals reviewed.   Assessment   1. Low back pain with radiculopathy  2. H/o abnormal C xray with right arm numbness  3. htN 4. Prediabetes  5. H/o kidney stones  6. HM Plan  1.  Xray low back consider MRI lumbar  Pt declines PT for now 2. Xray C spine  3. Re ordered hctz 12.5 mg qd  Check labs today CMET, CBC, lipid, A1C, TSh, T4, UA vit D declines hep B check or STD check  4 a1c toda y 5. UA KUB today h/o 2010 CT ab/pelvis +kidney stones nonobstructin  6.  Declined flu shot  Tdap declines  Declines hep B status   Per pt hold on mammo for now and derm last saw Dr. Kellie Moor 2 years ago h/o skin cancer left nose  S/p hysterectomy ? Reason to see if needs to do pap    Provider: Dr. Olivia Mackie McLean-Scocuzza-[facility]

## 2017-05-09 NOTE — Patient Instructions (Signed)
Follow up in 3 weeks   Paresthesia Paresthesia is an abnormal burning or prickling sensation. This sensation is generally felt in the hands, arms, legs, or feet. However, it may occur in any part of the body. Usually, it is not painful. The feeling may be described as:  Tingling or numbness.  Pins and needles.  Skin crawling.  Buzzing.  Limbs falling asleep.  Itching.  Most people experience temporary (transient) paresthesia at some time in their lives. Paresthesia may occur when you breathe too quickly (hyperventilation). It can also occur without any apparent cause. Commonly, paresthesia occurs when pressure is placed on a nerve. The sensation quickly goes away after the pressure is removed. For some people, however, paresthesia is a long-lasting (chronic) condition that is caused by an underlying disorder. If you continue to have paresthesia, you may need further medical evaluation. Follow these instructions at home: Watch your condition for any changes. Taking the following actions may help to lessen any discomfort that you are feeling:  Avoid drinking alcohol.  Try acupuncture or massage to help relieve your symptoms.  Keep all follow-up visits as directed by your health care provider. This is important.  Contact a health care provider if:  You continue to have episodes of paresthesia.  Your burning or prickling feeling gets worse when you walk.  You have pain, cramps, or dizziness.  You develop a rash. Get help right away if:  You feel weak.  You have trouble walking or moving.  You have problems with speech, understanding, or vision.  You feel confused.  You cannot control your bladder or bowel movements.  You have numbness after an injury.  You faint. This information is not intended to replace advice given to you by your health care provider. Make sure you discuss any questions you have with your health care provider. Document Released: 02/08/2002 Document  Revised: 07/27/2015 Document Reviewed: 02/14/2014 Elsevier Interactive Patient Education  2018 [employer].  Back Pain, Adult Many adults have back pain from time to time. Common causes of back pain include:  A strained muscle or ligament.  Wear and tear (degeneration) of the spinal disks.  Arthritis.  A hit to the back.  Back pain can be short-lived (acute) or last a long time (chronic). A physical exam, lab tests, and imaging studies may be done to find the cause of your pain. Follow these instructions at home: Managing pain and stiffness  Take over-the-counter and prescription medicines only as told by your health care provider.  If directed, apply heat to the affected area as often as told by your health care provider. Use the heat source that your health care provider recommends, such as a moist heat pack or a heating pad. ? Place a towel between your skin and the heat source. ? Leave the heat on for 20-30 minutes. ? Remove the heat if your skin turns bright red. This is especially important if you are unable to feel pain, heat, or cold. You have a greater risk of getting burned.  If directed, apply ice to the injured area: ? Put ice in a plastic bag. ? Place a towel between your skin and the bag. ? Leave the ice on for 20 minutes, 2-3 times a day for the first 2-3 days. Activity  Do not stay in bed. Resting more than 1-2 days can delay your recovery.  Take short walks on even surfaces as soon as you are able. Try to increase the length of time you walk  each day.  Do not sit, drive, or stand in one place for more than 30 minutes at a time. Sitting or standing for long periods of time can put stress on your back.  Use proper lifting techniques. When you bend and lift, use positions that put less stress on your back: ? [place] your knees. ? Keep the load close to your body. ? Avoid twisting.  Exercise regularly as told by your health care provider. Exercising will help  your back heal faster. This also helps prevent back injuries by keeping muscles strong and flexible.  Your health care provider may recommend that you see a physical therapist. This person can help you come up with a safe exercise program. Do any exercises as told by your physical therapist. Lifestyle  Maintain a healthy weight. Extra weight puts stress on your back and makes it difficult to have good posture.  Avoid activities or situations that make you feel anxious or stressed. Learn ways to manage anxiety and stress. One way to manage stress is through exercise. Stress and anxiety increase muscle tension and can make back pain worse. General instructions  Sleep on a firm mattress in a comfortable position. Try lying on your side with your knees slightly bent. If you lie on your back, put a pillow under your knees.  Follow your treatment plan as told by your health care provider. This may include: ? Cognitive or behavioral therapy. ? Acupuncture or massage therapy. ? Meditation or yoga. Contact a health care provider if:  You have pain that is not relieved with rest or medicine.  You have increasing pain going down into your legs or buttocks.  Your pain does not improve in 2 weeks.  You have pain at night.  You lose weight.  You have a fever or chills. Get help right away if:  You develop new bowel or bladder control problems.  You have unusual weakness or numbness in your arms or legs.  You develop nausea or vomiting.  You develop abdominal pain.  You feel faint. Summary  Many adults have back pain from time to time. A physical exam, lab tests, and imaging studies may be done to find the cause of your pain.  Use proper lifting techniques. When you bend and lift, use positions that put less stress on your back.  Take over-the-counter and prescription medicines and apply heat or ice as directed by your health care provider. This information is not intended to replace  advice given to you by your health care provider. Make sure you discuss any questions you have with your health care provider. Document Released: 02/18/2005 Document Revised: 03/25/2016 Document Reviewed: 03/25/2016 Elsevier Interactive Patient Education  [employer].

## 2017-05-09 NOTE — Progress Notes (Signed)
Pre visit review using our clinic review tool, if applicable. No additional management support is needed unless otherwise documented below in the visit note. 

## 2017-05-30 ENCOUNTER — Ambulatory Visit: Payer: BC Managed Care – PPO | Admitting: Family

## 2018-05-13 ENCOUNTER — Other Ambulatory Visit: Payer: Self-pay

## 2018-05-13 ENCOUNTER — Ambulatory Visit: Payer: BC Managed Care – PPO | Admitting: Internal Medicine

## 2018-05-13 ENCOUNTER — Ambulatory Visit (INDEPENDENT_AMBULATORY_CARE_PROVIDER_SITE_OTHER): Payer: BC Managed Care – PPO

## 2018-05-13 ENCOUNTER — Encounter: Payer: Self-pay | Admitting: Internal Medicine

## 2018-05-13 VITALS — BP 136/90 | HR 101 | Temp 97.9°F | Resp 16 | Ht 60.0 in | Wt 261.0 lb

## 2018-05-13 DIAGNOSIS — R05 Cough: Secondary | ICD-10-CM | POA: Diagnosis not present

## 2018-05-13 DIAGNOSIS — R058 Other specified cough: Secondary | ICD-10-CM

## 2018-05-13 DIAGNOSIS — R059 Cough, unspecified: Secondary | ICD-10-CM

## 2018-05-13 MED ORDER — AZITHROMYCIN 250 MG PO TABS: ORAL_TABLET | ORAL | 0 refills | Status: DC

## 2018-05-13 MED ORDER — PREDNISONE 10 MG PO TABS: ORAL_TABLET | ORAL | 0 refills | Status: DC

## 2018-05-13 MED ORDER — HYDROCOD POLST-CPM POLST ER 10-8 MG/5ML PO SUER: 5.0000 mL | Freq: Every evening | ORAL | 0 refills | Status: DC | PRN

## 2018-05-13 MED ORDER — ALBUTEROL SULFATE HFA 108 (90 BASE) MCG/ACT IN AERS: 2.0000 | INHALATION_SPRAY | Freq: Four times a day (QID) | RESPIRATORY_TRACT | 1 refills | Status: DC | PRN

## 2018-05-13 NOTE — Progress Notes (Signed)
Subjective:  Patient ID: Crystal Leonard, female    DOB: 1970/08/24  Age: 48 y.o. MRN: [id number]  CC: The primary encounter diagnosis was Cough present for greater than 3 weeks. A diagnosis of Cough was also pertinent to this visit.  HPI Crystal Leonard presents for EVALUATION OF COUGH AND SORE THROAT with sinus congestion.   Cough and sore throat has been present for months . No prior treatment. Sinus congestion is worse in the morning  . Cough is paroxysmal at times.   Last seen one year ago .  Still smoking,  But not in the last week .  No night sweats,  Unintentional weight loss, or chest pain.   Outpatient Medications Prior to Visit  Medication Sig Dispense Refill  . citalopram (CELEXA) 20 MG tablet Take 20 mg by mouth daily.    . DULoxetine (CYMBALTA) 60 MG capsule Take 60 mg by mouth daily.    . hydrochlorothiazide (HYDRODIURIL) 12.5 MG tablet Take 1 tablet (12.5 mg total) by mouth daily. 90 tablet 3  . Multiple Vitamin (MULTIVITAMIN) tablet Take 1 tablet by mouth daily.    Marland Kitchen omeprazole (PRILOSEC) 20 MG capsule Take 20 mg by mouth as needed.    Marland Kitchen albuterol (PROVENTIL HFA) 108 (90 Base) MCG/ACT inhaler Inhale 2 puffs into the lungs every 6 (six) hours as needed for wheezing or shortness of breath. 1 Inhaler 1  . benzonatate (TESSALON) 100 MG capsule Take 1 capsule (100 mg total) by mouth 2 (two) times daily as needed for cough. (Patient not taking: Reported on 05/13/2018) 30 capsule 1  . diclofenac (VOLTAREN) 75 MG EC tablet Take 1 tablet (75 mg total) by mouth 2 (two) times daily as needed. (Patient not taking: Reported on 05/13/2018) 60 tablet 1  . HYDROcodone-homatropine (HYCODAN) 5-1.5 MG/5ML syrup Take 5 mLs by mouth at bedtime as needed for cough. (Patient not taking: Reported on 05/13/2018) 30 mL 0   Facility-Administered Medications Prior to Visit  Medication Dose Route Frequency Provider Last Rate Last Dose  . albuterol (PROVENTIL) (2.5 MG/3ML) 0.083% nebulizer solution 2.5 mg   2.5 mg Nebulization Once Burnard Hawthorne, FNP        Review of Systems;  Patient denies headache, fevers, malaise, unintentional weight loss, skin rash, eye pain, , dysphagia,  hemoptysis ,  dyspnea, wheezing, chest pain, palpitations, orthopnea, edema, abdominal pain, nausea, melena, diarrhea, constipation, flank pain, dysuria, hematuria, urinary  Frequency, nocturia, numbness, tingling, seizures,  Focal weakness, Loss of consciousness,  Tremor, insomnia, depression, anxiety, and suicidal ideation.      Objective:  BP 136/90 (BP Location: Left Arm, Patient Position: Sitting, Cuff Size: Large)   Pulse (!) 101   Temp 97.9 F (36.6 C) (Oral)   Resp 16   Ht 5' (1.524 m)   Wt 261 lb (118.4 kg)   SpO2 96%   BMI 50.97 kg/m   BP Readings from Last 3 Encounters:  05/13/18 136/90  05/09/17 130/88  11/25/16 126/80    Wt Readings from Last 3 Encounters:  05/13/18 261 lb (118.4 kg)  05/09/17 252 lb 12.8 oz (114.7 kg)  11/25/16 248 lb (112.5 kg)    General appearance: alert, cooperative and appears stated age Ears: normal TM's and external ear canals both ears Throat: lips, mucosa, and tongue normal; teeth and gums normal Neck: no adenopathy, no carotid bruit, supple, symmetrical, trachea midline and thyroid not enlarged, symmetric, no tenderness/mass/nodules Back: symmetric, no curvature. ROM normal. No CVA tenderness. Lungs: clear to auscultation  bilaterally Heart: regular rate and rhythm, S1, S2 normal, no murmur, click, rub or gallop Abdomen: soft, non-tender; bowel sounds normal; no masses,  no organomegaly Pulses: 2+ and symmetric Skin: Skin color, texture, turgor normal. No rashes or lesions Lymph nodes: Cervical, supraclavicular, and axillary nodes normal.  Lab Results  Component Value Date   HGBA1C 6.4 05/09/2017   HGBA1C 6.3 07/06/2014    Lab Results  Component Value Date   CREATININE 0.86 05/09/2017   CREATININE 0.9 05/09/2015   CREATININE 0.89 07/06/2014     Lab Results  Component Value Date   WBC 13.9 (H) 05/13/2018   HGB 15.5 (H) 05/13/2018   HCT 46.2 (H) 05/13/2018   PLT 473.0 (H) 05/13/2018   GLUCOSE 102 (H) 05/09/2017   CHOL 190 05/09/2017   TRIG 143.0 05/09/2017   HDL 41.50 05/09/2017   LDLCALC 120 (H) 05/09/2017   ALT 15 05/09/2017   AST 15 05/09/2017   NA 139 05/09/2017   K 4.1 05/09/2017   CL 102 05/09/2017   CREATININE 0.86 05/09/2017   BUN 16 05/09/2017   CO2 30 05/09/2017   TSH 0.85 05/09/2017   HGBA1C 6.4 05/09/2017    Dg Cervical Spine Complete  Result Date: 11/29/2014 CLINICAL DATA:  Right-sided neck pain EXAM: CERVICAL SPINE  4+ VIEWS COMPARISON:  None. FINDINGS: Seven cervical segments are well visualized. Vertebral body height is well maintained. Mild disc space narrowing with osteophytic changes are noted at C5-6. No significant neural foraminal narrowing is seen. No acute fracture or acute facet abnormality is noted. The odontoid is within normal limits. No prevertebral soft tissue swelling is seen. IMPRESSION: No acute abnormality noted. Electronically Signed   By: Inez Catalina M.D.   On: 11/29/2014 08:48   Dg Shoulder Right  Result Date: 11/29/2014 CLINICAL DATA:  Right shoulder pain for 2 weeks with tingling in the right hand. No known injury. Initial encounter. EXAM: RIGHT SHOULDER - 2+ VIEW COMPARISON:  None. FINDINGS: There is no evidence of fracture or dislocation. There is no evidence of arthropathy or other focal bone abnormality. Soft tissues are unremarkable. IMPRESSION: Normal exam. Electronically Signed   By: Inge Rise M.D.   On: 11/29/2014 08:43    Assessment & Plan:   Problem List Items Addressed This Visit    Cough present for greater than 3 weeks - Primary    3 month history of paroxysmal cough in a smoker. No wheezing on exam, chest x ray shows no infiltrate but has an interstitial pattern. Empiric  Azithromycin , prednisone and cough suppressant.  Pertussis screen pending        Relevant Medications   albuterol (PROVENTIL HFA) 108 (90 Base) MCG/ACT inhaler   Other Relevant Orders   DG Chest 2 View (Completed)   CBC with Differential/Platelet (Completed)   Bordetella pertussis toxin (PT) AB (IgG), Immunoassay    Other Visit Diagnoses    Cough       Relevant Medications   albuterol (PROVENTIL HFA) 108 (90 Base) MCG/ACT inhaler     A total of 25 minutes of face to face time was spent with patient more than half of which was spent in counselling about the above mentioned conditions  and coordination of care  I have discontinued Socorro L. Buntin "Shakiyah Luallen"'s diclofenac, HYDROcodone-homatropine, and benzonatate. I am also having her start on predniSONE, azithromycin, and chlorpheniramine-HYDROcodone. Additionally, I am having her maintain her omeprazole, multivitamin, citalopram, DULoxetine, hydrochlorothiazide, and albuterol. We will continue to administer albuterol.  Meds ordered  this encounter  Medications  . predniSONE (DELTASONE) 10 MG tablet    Sig: 6 tablets on Day 1 , then reduce by 1 tablet daily until gone    Dispense:  21 tablet    Refill:  0  . azithromycin (ZITHROMAX) 250 MG tablet    Sig: 2 tablets one day 1,  One tablet daily until gone    Dispense:  6 tablet    Refill:  0  . chlorpheniramine-HYDROcodone (TUSSIONEX PENNKINETIC ER) 10-8 MG/5ML SUER    Sig: Take 5 mLs by mouth at bedtime as needed.    Dispense:  140 mL    Refill:  0  . albuterol (PROVENTIL HFA) 108 (90 Base) MCG/ACT inhaler    Sig: Inhale 2 puffs into the lungs every 6 (six) hours as needed for wheezing or shortness of breath.    Dispense:  1 Inhaler    Refill:  1    Medications Discontinued During This Encounter  Medication Reason  . benzonatate (TESSALON) 100 MG capsule Completed Course  . diclofenac (VOLTAREN) 75 MG EC tablet Patient has not taken in last 30 days  . HYDROcodone-homatropine (HYCODAN) 5-1.5 MG/5ML syrup Patient has not taken in last 30 days  .  albuterol (PROVENTIL HFA) 108 (90 Base) MCG/ACT inhaler Reorder    Follow-up: No follow-ups on file.   Crecencio Mc, MD

## 2018-05-13 NOTE — Patient Instructions (Signed)
I am treating you for bronchitis/pneumonia  Start the prednisone  TOMORROW  START THE AZITHROMYCIN TONIGHT  Take the tussionex as early in the evening as possible since it is long acting and sedating    Please take a probiotic ( Align, [place] or Culturelle) of the generic version of one of these  For a minimum of 3 weeks to prevent a serious antibiotic associated diarrhea  Called clostridium dificile colitis

## 2018-05-14 ENCOUNTER — Encounter: Payer: Self-pay | Admitting: Internal Medicine

## 2018-05-14 LAB — CBC WITH DIFFERENTIAL/PLATELET
Basophils Absolute: 0.2 10*3/uL — ABNORMAL HIGH (ref 0.0–0.1)
Basophils Relative: 1.2 % (ref 0.0–3.0)
Eosinophils Absolute: 0.3 10*3/uL (ref 0.0–0.7)
Eosinophils Relative: 2 % (ref 0.0–5.0)
HCT: 46.2 % — ABNORMAL HIGH (ref 36.0–46.0)
Hemoglobin: 15.5 g/dL — ABNORMAL HIGH (ref 12.0–15.0)
Lymphocytes Relative: 34.1 % (ref 12.0–46.0)
Lymphs Abs: 4.8 10*3/uL — ABNORMAL HIGH (ref 0.7–4.0)
MCHC: 33.5 g/dL (ref 30.0–36.0)
MCV: 88.9 fl (ref 78.0–100.0)
Monocytes Absolute: 0.9 10*3/uL (ref 0.1–1.0)
Monocytes Relative: 6.1 % (ref 3.0–12.0)
Neutro Abs: 7.9 10*3/uL — ABNORMAL HIGH (ref 1.4–7.7)
Neutrophils Relative %: 56.6 % (ref 43.0–77.0)
Platelets: 473 10*3/uL — ABNORMAL HIGH (ref 150.0–400.0)
RBC: 5.2 Mil/uL — ABNORMAL HIGH (ref 3.87–5.11)
RDW: 14.3 % (ref 11.5–15.5)
WBC: 13.9 10*3/uL — ABNORMAL HIGH (ref 4.0–10.5)

## 2018-05-14 NOTE — Assessment & Plan Note (Signed)
3 month history of paroxysmal cough in a smoker. No wheezing on exam, chest x ray shows no infiltrate but has an interstitial pattern. Empiric  Azithromycin , prednisone and cough suppressant.  Pertussis screen pending

## 2018-05-17 LAB — BORDETELLA PERTUSSIS TOXIN (PT) AB (IGG), IMMUNOASSAY: Bordetella pertussis toxin(PT) Ab (IgG), IA: 27 IU/mL

## 2018-06-06 ENCOUNTER — Other Ambulatory Visit: Payer: Self-pay | Admitting: Family

## 2018-06-06 DIAGNOSIS — I10 Essential (primary) hypertension: Secondary | ICD-10-CM

## 2018-06-08 MED ORDER — HYDROCHLOROTHIAZIDE 12.5 MG PO TABS: 12.5000 mg | ORAL_TABLET | Freq: Every day | ORAL | 0 refills | Status: DC

## 2018-07-07 ENCOUNTER — Telehealth: Payer: BC Managed Care – PPO | Admitting: Physician Assistant

## 2018-07-07 DIAGNOSIS — J069 Acute upper respiratory infection, unspecified: Secondary | ICD-10-CM | POA: Diagnosis not present

## 2018-07-07 MED ORDER — IPRATROPIUM BROMIDE 0.06 % NA SOLN: 2.0000 | Freq: Four times a day (QID) | NASAL | 0 refills | Status: DC

## 2018-07-07 MED ORDER — BENZONATATE 100 MG PO CAPS: 100.0000 mg | ORAL_CAPSULE | Freq: Three times a day (TID) | ORAL | 0 refills | Status: DC

## 2018-07-07 NOTE — Progress Notes (Signed)
We are sorry you are not feeling well.  Here is how we plan to help!  Based on what you have shared with me, it looks like you may have a viral upper respiratory infection.  Upper respiratory infections are caused by a large number of viruses; however, rhinovirus is the most common cause.   Symptoms vary from person to person, with common symptoms including sore throat, cough, and fatigue or lack of energy.  A low-grade fever of up to 100.4 may present, but is often uncommon.  Symptoms vary however, and are closely related to a person's age or underlying illnesses.  The most common symptoms associated with an upper respiratory infection are nasal discharge or congestion, cough, sneezing, headache and pressure in the ears and face.  These symptoms usually persist for about 3 to 10 days, but can last up to 2 weeks.  It is important to know that upper respiratory infections do not cause serious illness or complications in most cases.    Upper respiratory infections can be transmitted from person to person, with the most common method of transmission being a person's hands.  The virus is able to live on the skin and can infect other persons for up to 2 hours after direct contact.  Also, these can be transmitted when someone coughs or sneezes; thus, it is important to cover the mouth to reduce this risk.  To keep the spread of the illness at [place], good hand hygiene is very important.  This is an infection that is most likely caused by a virus. There are no specific treatments other than to help you with the symptoms until the infection runs its course.  We are sorry you are not feeling well.  Here is how we plan to help!   For nasal congestion, you may use an oral decongestants such as Mucinex D or if you have glaucoma or high blood pressure use plain Mucinex.  Saline nasal spray or nasal drops can help and can safely be used as often as needed for congestion.  For your congestion, I have prescribed Ipratropium  Bromide nasal spray 0.03% two sprays in each nostril 2-3 times a day  If you do not have a history of heart disease, hypertension, diabetes or thyroid disease, prostate/bladder issues or glaucoma, you may also use Sudafed to treat nasal congestion.  It is highly recommended that you consult with a pharmacist or your primary care physician to ensure this medication is safe for you to take.     If you have a cough, you may use cough suppressants such as Delsym and Robitussin.  If you have glaucoma or high blood pressure, you can also use Coricidin HBP.   For cough I have prescribed for you A prescription cough medication called Tessalon Perles 100 mg. You may take 1-2 capsules every 8 hours as needed for cough  If you have a sore or scratchy throat, use a saltwater gargle-  to  teaspoon of salt dissolved in a 4-ounce to 8-ounce glass of warm water.  Gargle the solution for approximately 15-30 seconds and then spit.  It is important not to swallow the solution.  You can also use throat lozenges/cough drops and Chloraseptic spray to help with throat pain or discomfort.  Warm or cold liquids can also be helpful in relieving throat pain.  For headache, pain or general discomfort, you can use Ibuprofen or Tylenol as directed.   Some authorities believe that zinc sprays or the use of Echinacea  may shorten the course of your symptoms.   HOME CARE Only take medications as instructed by your medical team. Be sure to drink plenty of fluids. Water is fine as well as fruit juices, sodas and electrolyte beverages. You may want to stay away from caffeine or alcohol. If you are nauseated, try taking small sips of liquids. How do you know if you are getting enough fluid? Your urine should be a pale yellow or almost colorless. Get rest. Taking a steamy shower or using a humidifier may help nasal congestion and ease sore throat pain. You can place a towel over your head and breathe in the steam from hot water coming  from a faucet. Using a saline nasal spray works much the same way. Cough drops, hard candies and sore throat lozenges may ease your cough. Avoid close contacts especially the very young and the elderly Cover your mouth if you cough or sneeze Always remember to wash your hands.   GET HELP RIGHT AWAY IF: You develop worsening fever. If your symptoms do not improve within 10 days You develop yellow or green discharge from your nose over 3 days. You have coughing fits You develop a severe head ache or visual changes. You develop shortness of breath, difficulty breathing or start having chest pain  Your symptoms persist after you have completed your treatment plan  MAKE SURE YOU  Understand these instructions. Will watch your condition. Will get help right away if you are not doing well or get worse.  Your e-visit answers were reviewed by a board certified advanced clinical practitioner to complete your personal care plan. Depending upon the condition, your plan could have included both over the counter or prescription medications. Please review your pharmacy choice. If there is a problem, you may call our nursing hot line at and have the prescription routed to another pharmacy. Your safety is important to [place]. If you have drug allergies check your prescription carefully.   You can use MyChart to ask questions about today's visit, request a non-urgent call back, or ask for a work or school excuse for 24 hours related to this e-Visit. If it has been greater than 24 hours you will need to follow up with your provider, or enter a new e-Visit to address those concerns. You will get an e-mail in the next two days asking about your experience.  I hope that your e-visit has been valuable and will speed your recovery. Thank you for using e-visits.      ===View-only below this line===   ----- Message -----    From: [name]    Sent: 07/07/2018 11:41 AM EDT      To: E-Visit Mailing  List Subject: E-Visit Submission: Sinus Problems  E-Visit Submission: Sinus Problems --------------------------------  Question: Which of the following have you been experiencing? Answer:   Congested nose            Pain around the nose and face            Headache            Cough  Question: Have these symptoms significantly worsened over the last two to three days? Answer:   Yes  Question: Have you had any of the following? Answer:   None of the above  Question: How long have you been having these symptoms? Answer:   5 days  Question: Do you have a fever? Answer:   I do not know  Question: Do you smoke?  Answer:   Sometimes  Question: Do you have any chronic illnesses, such as diabetes, heart disease, kidney disease, or lung disease, or any illness that would weaken your body's ability to fight infection? Answer:   No  Question: When you blow your nose, what color is the mucus? Answer:   Mostly thin and yellow or green  Question: Have you experienced similar problems in the past? Answer:   Yes  Question: What treatments have worked in the past?  Answer:   Antibiotic            Steroid  Question: What treatment(s) in the past have been unsuccessful? Answer:     Question: Is this illness similar to previous illnesses you have had?  How is it the same?  How is it different? Answer:   Previous sinus infections. I had a migraine over the weekend which I haven't had in about 15 years. I had dizziness yesterday. My ears feel like they have fluid in them.  Question: Have you recently been hospitalized? Answer:   No  Question: What medications are you currently taking for these symptoms? Answer:   Decongestants            Pain medicine  Question: Please enter the names of any medications you are taking, or any other treatments you are trying. Answer:   Tylenol Sinus & Headache.            [facility] daytime allergy  Question: Are you pregnant? Answer:   I am confident  that I am not pregnant  Question: Are you breastfeeding? Answer:   No  Question: Please list your medication allergies that you may have ? (If 'none' , please list as 'none') Answer:   None  Question: Please list any additional comments  Answer:     A total of 5-10 minutes was spent evaluating this patients questionnaire and formulating a plan of care.

## 2018-07-29 ENCOUNTER — Other Ambulatory Visit: Payer: Self-pay | Admitting: Physician Assistant

## 2018-09-02 ENCOUNTER — Other Ambulatory Visit: Payer: Self-pay | Admitting: Family

## 2018-09-02 DIAGNOSIS — I10 Essential (primary) hypertension: Secondary | ICD-10-CM

## 2018-09-24 ENCOUNTER — Other Ambulatory Visit: Payer: Self-pay | Admitting: Family

## 2018-09-24 DIAGNOSIS — Z20822 Contact with and (suspected) exposure to covid-19: Secondary | ICD-10-CM

## 2018-09-27 LAB — NOVEL CORONAVIRUS, NAA: SARS-CoV-2, NAA: DETECTED — AB

## 2018-10-07 ENCOUNTER — Emergency Department
Admission: EM | Admit: 2018-10-07 | Discharge: 2018-10-07 | Disposition: A | Discharge status: Home / Self Care | Payer: BC Managed Care – PPO | Attending: Emergency Medicine | Admitting: Emergency Medicine

## 2018-10-07 ENCOUNTER — Encounter: Payer: Self-pay | Admitting: Emergency Medicine

## 2018-10-07 ENCOUNTER — Ambulatory Visit: Payer: Self-pay | Admitting: Family

## 2018-10-07 ENCOUNTER — Emergency Department: Payer: BC Managed Care – PPO

## 2018-10-07 ENCOUNTER — Other Ambulatory Visit: Payer: Self-pay

## 2018-10-07 DIAGNOSIS — Z79899 Other long term (current) drug therapy: Secondary | ICD-10-CM | POA: Insufficient documentation

## 2018-10-07 DIAGNOSIS — Z85828 Personal history of other malignant neoplasm of skin: Secondary | ICD-10-CM | POA: Insufficient documentation

## 2018-10-07 DIAGNOSIS — U071 COVID-19: Secondary | ICD-10-CM | POA: Diagnosis not present

## 2018-10-07 DIAGNOSIS — I10 Essential (primary) hypertension: Secondary | ICD-10-CM | POA: Diagnosis not present

## 2018-10-07 DIAGNOSIS — F17210 Nicotine dependence, cigarettes, uncomplicated: Secondary | ICD-10-CM | POA: Diagnosis not present

## 2018-10-07 DIAGNOSIS — R0602 Shortness of breath: Secondary | ICD-10-CM | POA: Diagnosis present

## 2018-10-07 LAB — CBC WITH DIFFERENTIAL/PLATELET
Abs Immature Granulocytes: 0 10*3/uL (ref 0.00–0.07)
Basophils Absolute: 0 10*3/uL (ref 0.0–0.1)
Basophils Relative: 0 %
Blasts: 2 %
Eosinophils Absolute: 0.1 10*3/uL (ref 0.0–0.5)
Eosinophils Relative: 1 %
HCT: 43.9 % (ref 36.0–46.0)
Hemoglobin: 14.4 g/dL (ref 12.0–15.0)
Lymphocytes Relative: 20 %
Lymphs Abs: 2.8 10*3/uL (ref 0.7–4.0)
MCH: 29.2 pg (ref 26.0–34.0)
MCHC: 32.8 g/dL (ref 30.0–36.0)
MCV: 89 fL (ref 80.0–100.0)
Monocytes Absolute: 0.7 10*3/uL (ref 0.1–1.0)
Monocytes Relative: 5 %
Neutro Abs: 9.4 10*3/uL — ABNORMAL HIGH (ref 1.7–7.7)
Neutrophils Relative %: 68 %
Other: 4 %
Platelets: 402 10*3/uL — ABNORMAL HIGH (ref 150–400)
RBC: 4.93 MIL/uL (ref 3.87–5.11)
RDW: 13.2 % (ref 11.5–15.5)
Smear Review: NORMAL
WBC Morphology: ABNORMAL
WBC: 13.8 10*3/uL — ABNORMAL HIGH (ref 4.0–10.5)
nRBC: 0 % (ref 0.0–0.2)

## 2018-10-07 LAB — BASIC METABOLIC PANEL
Anion gap: 12 (ref 5–15)
BUN: 17 mg/dL (ref 6–20)
CO2: 25 mmol/L (ref 22–32)
Calcium: 9.8 mg/dL (ref 8.9–10.3)
Chloride: 103 mmol/L (ref 98–111)
Creatinine, Ser: 0.8 mg/dL (ref 0.44–1.00)
GFR calc Af Amer: >60 mL/min (ref >60)
GFR calc non Af Amer: >60 mL/min (ref >60)
Glucose, Bld: 111 mg/dL — ABNORMAL HIGH (ref 70–99)
Potassium: 3.2 mmol/L — ABNORMAL LOW (ref 3.5–5.1)
Sodium: 140 mmol/L (ref 135–145)

## 2018-10-07 LAB — TROPONIN I (HIGH SENSITIVITY)
Troponin I (High Sensitivity): 4 ng/L (ref <18)
Troponin I (High Sensitivity): 3 ng/L (ref <18)

## 2018-10-07 LAB — FIBRIN DERIVATIVES D-DIMER (ARMC ONLY): Fibrin derivatives D-dimer (ARMC): 373.24 ng/mL (FEU) (ref 0.00–499.00)

## 2018-10-07 LAB — MAGNESIUM: Magnesium: 2 mg/dL (ref 1.7–2.4)

## 2018-10-07 MED ORDER — POTASSIUM CHLORIDE CRYS ER 20 MEQ PO TBCR
40.0000 meq | EXTENDED_RELEASE_TABLET | Freq: Once | ORAL | Status: AC
  Administered 2018-10-07: 40 meq via ORAL
  Filled 2018-10-07: qty 2

## 2018-10-07 NOTE — Discharge Instructions (Signed)
Your work-up was reassuring.  Your K was slightly low.  You had an elevated white count of 14 with smudge cells.  This is most likely secondary to the infection however he should have a repeat CBC with differential done after you have recovered from your illness.  Return to ER for any worsening symptoms or any other concerns.

## 2018-10-07 NOTE — Telephone Encounter (Signed)
Pt reports tested positive for covid 19 09/27/2018. States quarantine period was up Sunday, pt went back to work today.   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  Reports today feels like "Constant fluttering." States HR at rest 112. Reports SOB at rest, chest heaviness. States O2 sat 96%,  Had several episodes of vertigo, "Spinning, felt like I was going to pass out." Presently home from work, lightheaded sitting. Directed to ED, states husband will drive. Will go to [facility]. Instructed to wear mask and alert staff upon arrival to symptoms and positive covid results. Pt verbalizes understanding.    Reason for Disposition . Extra heart beats OR irregular heart beating  (i.e., "palpitations")  Answer Assessment - Initial Assessment Questions 1. DESCRIPTION: "Describe your dizziness."     spinning 2. LIGHTHEADED: "Do you feel lightheaded?" (e.g., somewhat faint, woozy, weak upon standing)     yes 3. VERTIGO: "Do you feel like either you or the room is spinning or tilting?" (i.e. vertigo)     Yes spinning 4. SEVERITY: "How bad is it?"  "Do you feel like you are going to faint?" "Can you stand and walk?"   - MILD - walking normally   - MODERATE - interferes with normal activities (e.g., work, school)    - SEVERE - unable to stand, requires support to walk, feels like passing out now.      severe 5. ONSET:  "When did the dizziness begin?"     1100, few more episodes 6. AGGRAVATING FACTORS: "Does anything make it worse?" (e.g., standing, change in head position)     no 7. HEART RATE: "Can you tell me your heart rate?" "How many beats in 15 seconds?"  (Note: not all patients can do this)       11 2 8. CAUSE: "What do you think is causing the dizziness?"    Unsure 9. RECURRENT SYMPTOM: "Have you had dizziness before?" If so, ask: "When was the  last time?" "What happened that time?"    no 10. OTHER SYMPTOMS: "Do you have any other symptoms?" (e.g., fever, chest pain, vomiting, diarrhea, bleeding)      Increased SOB, palpitations, HR 112 at rest, SOB at rest chest heaviness  Protocols used: DIZZINESS [facility]

## 2018-10-07 NOTE — Telephone Encounter (Signed)
In ed 

## 2018-10-07 NOTE — ED Triage Notes (Addendum)
FIRST NURSE NOTE- COVID+. Here for dizzy and feeling worse.  Ambulatory without difficulty. Unlabored. sats 98% RA at check in. Positive test was 13 days ago.

## 2018-10-07 NOTE — ED Notes (Signed)
Pt c/o palpitations and inc WOB/SOB this morning; denies SOB/CP/palpitations now. Pt in NSR on monitor.

## 2018-10-07 NOTE — ED Provider Notes (Signed)
[facility] Emergency Department Provider Note  ____________________________________________   First MD Initiated Contact with Patient 10/07/18 1640     (approximate)  I have reviewed the triage vital signs and the nursing notes.   HISTORY  Chief Complaint Covid+    HPI Crystal Leonard is a 48 y.o. female with hypertension, depression who presents with shortness of breath and heart palpitations.  Patient was diagnosed coronavirus 12 days ago.  She was cleared to go back to work after a 10-day.Marland Kitchen  However at work today she started having some chest pressure and feeling like her heart was racing.  She said her heart rate was up to 130s.  This was intermittent, nothing brought it on, nothing made it worse.  It was associated with some shortness of breath and feeling dizzy in nature.  She not lose consciousness.  No abdominal pain, no urinary symptoms.  She has had a mild cough still.  Patient is feeling better now.  Denies any current dizziness.   Past Medical History:  Diagnosis Date  . Chicken pox   . Depression   . Headache    Frequent headaches  . Hypertension   . Kidney stones   . Migraine   . UTI (lower urinary tract infection)     Patient Active Problem List   Diagnosis Date Noted  . Skin cancer 05/09/2017  . Prediabetes 05/09/2017  . Hyperlipidemia 05/09/2017  . 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 Kidney stone 05/09/2017  . Cervicalgia 05/09/2017  . Chronic pain of left knee 09/01/2016  . Cough present for greater than 3 weeks 08/02/2015  . Paresthesia of both hands 07/06/2014  . HTN (hypertension) 05/07/2014  . Depression 05/07/2014  . Migraines 05/07/2014  . Tobacco use disorder 05/07/2014    Past Surgical History:  Procedure Laterality Date  . ABDOMINAL HYSTERECTOMY  2012  . LAPAROSCOPIC ENDOMETRIOSIS FULGURATION    . TONSILLECTOMY AND ADENOIDECTOMY      Prior to Admission medications    Medication Sig Start Date End Date Taking? Authorizing Provider  albuterol (PROVENTIL HFA) 108 (90 Base) MCG/ACT inhaler Inhale 2 puffs into the lungs every 6 (six) hours as needed for wheezing or shortness of breath. 05/13/18   Crecencio Mc, MD  azithromycin (ZITHROMAX) 250 MG tablet 2 tablets one day 1,  One tablet daily until gone 05/13/18   Crecencio Mc, MD  benzonatate (TESSALON) 100 MG capsule Take 1-2 capsules (100-200 mg total) by mouth 3 (three) times daily. 07/07/18   Tereasa Coop, PA-C  chlorpheniramine-HYDROcodone (TUSSIONEX PENNKINETIC ER) 10-8 MG/5ML SUER Take 5 mLs by mouth at bedtime as needed. 05/13/18   Crecencio Mc, MD  citalopram (CELEXA) 20 MG tablet Take 20 mg by mouth daily.    [provider]  DULoxetine (CYMBALTA) 60 MG capsule Take 60 mg by mouth daily.    [provider]  hydrochlorothiazide (HYDRODIURIL) 12.5 MG tablet TAKE 1 TABLET BY MOUTH EVERY DAY 09/02/18   Burnard Hawthorne, FNP  ipratropium (ATROVENT) 0.06 % nasal spray Place 2 sprays into both nostrils 4 (four) times daily. 07/07/18   Tereasa Coop, PA-C  Multiple Vitamin (MULTIVITAMIN) tablet Take 1 tablet by mouth daily.    [provider]  omeprazole (PRILOSEC) 20 MG capsule Take 20 mg by mouth as needed.    [provider]  predniSONE (DELTASONE) 10 MG tablet 6 tablets on Day 1 , then reduce by 1 tablet daily until  gone 05/13/18   Crecencio Mc, MD    Allergies Tetanus toxoids  Family History  Problem Relation Age of Onset  . Hypertension Mother   . Cancer Father        prostate  . Hypertension Maternal Grandmother   . Diabetes Brother   . Diabetes Maternal Aunt     Social History Social History   Tobacco Use  . Smoking status: Current Every Day Smoker    Packs/day: 1.00    Types: Cigarettes  . Smokeless tobacco: Never Used  Substance Use Topics  . Alcohol use: Yes    Alcohol/week: 0.0 standard drinks    Comment: Rare occasions   . Drug use:  No      Review of Systems Constitutional: No fever/chills Eyes: No visual changes. ENT: No sore throat. Cardiovascular: Positive chest discomfort, positive palpitations Respiratory: Positive for SOB, positive cough Gastrointestinal: No abdominal pain.  No nausea, no vomiting.  No diarrhea.  No constipation. Genitourinary: Negative for dysuria. Musculoskeletal: Negative for back pain. Skin: Negative for rash. Neurological: Negative for headaches, focal weakness or numbness.  Positive dizziness All other ROS negative ____________________________________________   PHYSICAL EXAM:  VITAL SIGNS: ED Triage Vitals [10/07/18 1553]  Enc Vitals Group     BP (!) 154/92     Pulse Rate 99     Resp 19     Temp 98.9 F (37.2 C)     Temp Source Oral     SpO2 98 %     Weight 260 lb (117.9 kg)     Height 5\' 6"  (1.676 m)     Head Circumference      Peak Flow      Pain Score 5     Pain Loc      Pain Edu?      Excl. in [place]?     Constitutional: Alert and oriented. Well appearing and in no acute distress. Eyes: Conjunctivae are normal. EOMI. Head: Atraumatic. Nose: No congestion/rhinnorhea. Mouth/Throat: Mucous membranes are moist.   Neck: No stridor. Trachea Midline. FROM Cardiovascular: Normal rate, regular rhythm. Grossly normal heart sounds.  Good peripheral circulation. Respiratory: Normal work of breathing, clear lungs Gastrointestinal: Soft and nontender. No distention. No abdominal bruits.  Musculoskeletal: No lower extremity tenderness nor edema.  No joint effusions. Neurologic:  Normal speech and language. No gross focal neurologic deficits are appreciated.  Normal finger-to-nose. Skin:  Skin is warm, dry and intact. No rash noted. Psychiatric: Mood and affect are normal. Speech and behavior are normal. GU: Deferred   ____________________________________________   LABS (all labs ordered are listed, but only abnormal results are displayed)  Labs Reviewed  CBC WITH  DIFFERENTIAL/PLATELET - Abnormal; Notable for the following components:      Result Value   WBC 13.8 (*)    Platelets 402 (*)    Neutro Abs 9.4 (*)    All other components within normal limits  BASIC METABOLIC PANEL - Abnormal; Notable for the following components:   Potassium 3.2 (*)    Glucose, Bld 111 (*)    All other components within normal limits  MAGNESIUM  FIBRIN DERIVATIVES D-DIMER ([facility] ONLY)  PATHOLOGIST SMEAR REVIEW  TROPONIN I (HIGH SENSITIVITY)  TROPONIN I (HIGH SENSITIVITY)   ____________________________________________   ED ECG REPORT I, Vanessa [place], the attending physician, personally viewed and interpreted this ECG.  EKG normal sinus rate of 92, no ST elevation except some minimal ST elevation in aVR that looks similar to prior., no T wave  inversion, normal intervals EKG is overall very similar to prior ____________________________________________  RADIOLOGY I, Vanessa [place], personally viewed and evaluated these images (plain radiographs) as part of my medical decision making, as well as reviewing the written report by the radiologist.  ED MD interpretation: No evidence of pneumonia  Official radiology report(s): Dg Chest Portable 1 View  Result Date: 10/07/2018 CLINICAL DATA:  Progressive shortness of breath.  COVID-19. EXAM: PORTABLE CHEST 1 VIEW COMPARISON:  05/13/2018 FINDINGS: The heart size and mediastinal contours are within normal limits. Both lungs are clear. The visualized skeletal structures are unremarkable. IMPRESSION: Normal exam. Electronically Signed   By: Lorriane Shire M.D.   On: 10/07/2018 17:19    ____________________________________________   PROCEDURES  Procedure(s) performed (including Critical Care):  Procedures   ____________________________________________   INITIAL IMPRESSION / ASSESSMENT AND PLAN / ED COURSE   Vernie Vinciguerra Ast was evaluated in Emergency Department on 10/07/2018 for the symptoms described in the history of  present illness. She was evaluated in the context of the global COVID-19 pandemic, which necessitated consideration that the patient might be at risk for infection with the SARS-CoV-2 virus that causes COVID-19. Institutional protocols and algorithms that pertain to the evaluation of patients at risk for COVID-19 are in a state of rapid change based on information released by regulatory bodies including the CDC and federal and state organizations. These policies and algorithms were followed during the patient's care in the ED.     Pt presents with SOB. Differential includes: PNA-will get xray to evaluation Anemia-CBC to evaluate ACS- will get trops Arrhythmia-Will get EKG and keep on monitor.  COVID- will get testing per algorithm. PE-denies any risk factors however given coronavirus will get d-dimer to re-stratify given patient would like to hold off on CT scan if at all possible.   Labs notable for an elevated white count of 13.8 most likely secondary to the recent diagnosis of coronavirus.  However they did comment on smudge cells.  Patient is no history of leukemia.  Will discuss with patient and have her follow-up with her primary care doctor for repeat differential when she is not acutely ill.  K was slightly low at 3.2 so we will supplement this.  Kidney function looks normal.  Troponin ruled her out for ACS is been going on for greater than 3 hours.  Chest x-ray without evidence of pneumonia.  D-dimer ruled her out for PE  Reevaluated patient continues to do well.  No arrhythmias noted.  Patient denies any current dizziness.  Discussed results with patient.  She is comfortable with this plan.  Told she should quarantine until she is symptom-free for 48 hours.  I discussed the provisional nature of ED diagnosis, the treatment so far, the ongoing plan of care, follow up appointments and return precautions with the patient and any family or support people present. They expressed understanding  and agreed with the plan, discharged home.          Clinical Course as of Oct 06 1832  Wed Oct 07, 2018  1826 WBC Morphology: Abnormal lymphocytes present [MF]  1827 WBC Morphology: Abnormal lymphocytes present [MF]    Clinical Course User Index [MF] Vanessa [place], MD     ____________________________________________   FINAL CLINICAL IMPRESSION(S) / ED DIAGNOSES   Final diagnoses:  [id number] virus infection     MEDICATIONS GIVEN DURING THIS VISIT:  Medications  potassium chloride SA (K-DUR) CR tablet 40 mEq (has no administration in time range)  ED Discharge Orders    None       Note:  This document was prepared using Dragon voice recognition software and may include unintentional dictation errors.   Vanessa [place], MD 10/07/18 [phone]

## 2018-10-07 NOTE — ED Triage Notes (Signed)
Pt in via POV, reports recemtly positive for COVID-19, out of quarantine on Sunday, went back to work today, reports worsening shortness of breath and dizziness.  States, "I feel like my heart is fluttering."  Ambulatory to triage upon arrival.  NAD noted at this time.

## 2018-10-07 NOTE — ED Notes (Signed)
Blue top sent to lab. 

## 2018-10-07 NOTE — ED Notes (Signed)
Called lab about D-dimer add-on. Lab states blue top is still needed. Will send as soon as IV access obtained.

## 2018-10-08 LAB — PATHOLOGIST SMEAR REVIEW

## 2018-11-25 ENCOUNTER — Other Ambulatory Visit: Payer: Self-pay | Admitting: Family

## 2018-11-25 DIAGNOSIS — I10 Essential (primary) hypertension: Secondary | ICD-10-CM

## 2019-02-19 ENCOUNTER — Other Ambulatory Visit: Payer: Self-pay | Admitting: Family

## 2019-02-19 DIAGNOSIS — I10 Essential (primary) hypertension: Secondary | ICD-10-CM

## 2019-03-14 ENCOUNTER — Other Ambulatory Visit: Payer: Self-pay | Admitting: Family

## 2019-03-14 DIAGNOSIS — I10 Essential (primary) hypertension: Secondary | ICD-10-CM

## 2019-04-01 ENCOUNTER — Telehealth: Payer: Self-pay | Admitting: Family

## 2019-04-01 ENCOUNTER — Other Ambulatory Visit: Payer: Self-pay | Admitting: Lab

## 2019-04-01 DIAGNOSIS — R059 Cough, unspecified: Secondary | ICD-10-CM

## 2019-04-01 DIAGNOSIS — R05 Cough: Secondary | ICD-10-CM

## 2019-04-01 DIAGNOSIS — I10 Essential (primary) hypertension: Secondary | ICD-10-CM

## 2019-04-01 MED ORDER — ALBUTEROL SULFATE HFA 108 (90 BASE) MCG/ACT IN AERS: 2.0000 | INHALATION_SPRAY | Freq: Four times a day (QID) | RESPIRATORY_TRACT | 0 refills | Status: DC | PRN

## 2019-04-01 MED ORDER — HYDROCHLOROTHIAZIDE 12.5 MG PO TABS: ORAL_TABLET | ORAL | 1 refills | Status: DC

## 2019-04-01 NOTE — Telephone Encounter (Signed)
Pt needs a refill on the following; hydrochlorothiazide (HYDRODIURIL) 12.5 MG tablet and albuterol (PROVENTIL HFA) 108 (90 Base) MCG/ACT inhaler. Pt has an appointment with [facility] on 04/26/2019, could not get her in sooner to do virtual. Patient had Covid last year.

## 2019-04-05 ENCOUNTER — Other Ambulatory Visit: Payer: Self-pay

## 2019-04-05 DIAGNOSIS — R059 Cough, unspecified: Secondary | ICD-10-CM

## 2019-04-05 DIAGNOSIS — I10 Essential (primary) hypertension: Secondary | ICD-10-CM

## 2019-04-05 DIAGNOSIS — R05 Cough: Secondary | ICD-10-CM

## 2019-04-05 MED ORDER — ALBUTEROL SULFATE HFA 108 (90 BASE) MCG/ACT IN AERS: 2.0000 | INHALATION_SPRAY | Freq: Four times a day (QID) | RESPIRATORY_TRACT | 0 refills | Status: DC | PRN

## 2019-04-05 MED ORDER — HYDROCHLOROTHIAZIDE 12.5 MG PO TABS: ORAL_TABLET | ORAL | 1 refills | Status: DC

## 2019-04-05 NOTE — Telephone Encounter (Signed)
I called & let patient know that prescriptions were sent to her pharmacy.

## 2019-04-09 ENCOUNTER — Ambulatory Visit (INDEPENDENT_AMBULATORY_CARE_PROVIDER_SITE_OTHER): Payer: BC Managed Care – PPO | Admitting: Internal Medicine

## 2019-04-09 ENCOUNTER — Encounter: Payer: Self-pay | Admitting: Internal Medicine

## 2019-04-09 ENCOUNTER — Other Ambulatory Visit: Payer: Self-pay

## 2019-04-09 VITALS — Ht 66.0 in | Wt 255.0 lb

## 2019-04-09 DIAGNOSIS — J9801 Acute bronchospasm: Secondary | ICD-10-CM

## 2019-04-09 DIAGNOSIS — Z1329 Encounter for screening for other suspected endocrine disorder: Secondary | ICD-10-CM

## 2019-04-09 DIAGNOSIS — Z1231 Encounter for screening mammogram for malignant neoplasm of breast: Secondary | ICD-10-CM | POA: Diagnosis not present

## 2019-04-09 DIAGNOSIS — I10 Essential (primary) hypertension: Secondary | ICD-10-CM

## 2019-04-09 DIAGNOSIS — R05 Cough: Secondary | ICD-10-CM | POA: Diagnosis not present

## 2019-04-09 DIAGNOSIS — R319 Hematuria, unspecified: Secondary | ICD-10-CM

## 2019-04-09 DIAGNOSIS — R7303 Prediabetes: Secondary | ICD-10-CM

## 2019-04-09 DIAGNOSIS — R059 Cough, unspecified: Secondary | ICD-10-CM

## 2019-04-09 DIAGNOSIS — E559 Vitamin D deficiency, unspecified: Secondary | ICD-10-CM

## 2019-04-09 MED ORDER — HYDROCHLOROTHIAZIDE 12.5 MG PO TABS: ORAL_TABLET | ORAL | 1 refills | Status: DC

## 2019-04-09 MED ORDER — BUDESONIDE-FORMOTEROL FUMARATE 80-4.5 MCG/ACT IN AERO: 2.0000 | INHALATION_SPRAY | Freq: Two times a day (BID) | RESPIRATORY_TRACT | 0 refills | Status: DC

## 2019-04-09 MED ORDER — ALBUTEROL SULFATE HFA 108 (90 BASE) MCG/ACT IN AERS: 2.0000 | INHALATION_SPRAY | Freq: Four times a day (QID) | RESPIRATORY_TRACT | 2 refills | Status: DC | PRN

## 2019-04-09 NOTE — Progress Notes (Signed)
Virtual Visit via Video Note  I connected with Crystal Leonard  on 04/09/19 at  2:50 PM EST by a video enabled telemedicine application and verified that I am speaking with the correct person using two identifiers.  Location patient:work  Location provider:work or home office Persons participating in the virtual visit: patient, provider  I discussed the limitations of evaluation and management by telemedicine and the availability of in person appointments. The patient expressed understanding and agreed to proceed.   HPI: 1. HTN on hctz 12.5 mg qd denies h/a or dizziness but she did have h/a yesterday which is resolved thought 2/2 stress/allergies 2. Bronchospasm and cough worse at night but cough is chronic she did have covid 10/2018 but does not feel like this is related or covid sxs at this time. She does have allergies and takes otc allergy pill and has dogs. She also c/o nasal congestion but uses netti pot we did disc Flonase otc today She is having to use Albuterol inhaler more freq and cough worse at night She does have h/o bronchitis and smoking 2-3 cig qd max was 1ppd but cut back  Declines for now to see pulm/allergy for cough    ROS: See pertinent positives and negatives per HPI.  Past Medical History:  Diagnosis Date  . Chicken pox   . Depression   . Headache    Frequent headaches  . Hypertension   . Kidney stones   . Migraine   . UTI (lower urinary tract infection)     Past Surgical History:  Procedure Laterality Date  . ABDOMINAL HYSTERECTOMY  2012  . LAPAROSCOPIC ENDOMETRIOSIS FULGURATION    . TONSILLECTOMY AND ADENOIDECTOMY      Family History  Problem Relation Age of Onset  . Hypertension Mother   . Cancer Father        prostate  . Hypertension Maternal Grandmother   . Diabetes Brother   . Diabetes Maternal Aunt     SOCIAL HX:  Lives with husband  Pets 4 dogs- Inside/outside dogs  No children Jobs is a Herbalist and pet sitting  Enjoys  hanging with friends and traveling    Current Outpatient Medications:  .  albuterol (PROVENTIL HFA) 108 (90 Base) MCG/ACT inhaler, Inhale 2 puffs into the lungs every 6 (six) hours as needed for wheezing or shortness of breath., Disp: 18 g, Rfl: 2 .  citalopram (CELEXA) 20 MG tablet, Take 20 mg by mouth daily., Disp: , Rfl:  .  DULoxetine (CYMBALTA) 60 MG capsule, Take 60 mg by mouth daily., Disp: , Rfl:  .  hydrochlorothiazide (HYDRODIURIL) 12.5 MG tablet, TAKE 1 TABLET BY MOUTH EVERY DAY. NEEDS FOLLOW-UP APPT, Disp: 90 tablet, Rfl: 1 .  Multiple Vitamin (MULTIVITAMIN) tablet, Take 1 tablet by mouth daily., Disp: , Rfl:  .  omeprazole (PRILOSEC) 20 MG capsule, Take 20 mg by mouth as needed., Disp: , Rfl:  .  budesonide-formoterol (SYMBICORT) 80-4.5 MCG/ACT inhaler, Inhale 2 puffs into the lungs 2 (two) times daily. Rinse mouth, Disp: 1 Inhaler, Rfl: 0  EXAM:  VITALS per patient if applicable:  GENERAL: alert, oriented, appears well and in no acute distress  HEENT: atraumatic, conjunttiva clear, no obvious abnormalities on inspection of external nose and ears  NECK: normal movements of the head and neck  LUNGS: on inspection no signs of respiratory distress, breathing rate appears normal, no obvious gross SOB, gasping or wheezing  CV: no obvious cyanosis  MS: moves all visible extremities without noticeable abnormality  PSYCH/NEURO: pleasant and cooperative, no obvious depression or anxiety, speech and thought processing grossly intact  ASSESSMENT AND PLAN:  Discussed the following assessment and plan:  Bronchospasm ? Allergic bronchitis vs asthma - Plan: albuterol (PROVENTIL HFA) 108 (90 Base) MCG/ACT inhaler, budesonide-formoterol (SYMBICORT) 80-4.5 MCG/ACT inhaler If above does not work consider referral allergy/pulm disc with pt today    Essential hypertension - Plan: hydrochlorothiazide (HYDRODIURIL) 12.5 MG tablet, Comprehensive metabolic panel, Lipid panel, CBC with  Differential/Platelet  Screening mammogram, encounter for - Plan: MM 3D SCREEN BREAST BILATERAL  -we discussed possible serious and likely etiologies, options for evaluation and workup, limitations of telemedicine visit vs in person visit, treatment, treatment risks and precautions. Pt prefers to treat via telemedicine empirically rather then risking or undertaking an in person visit at this moment. Patient agrees to seek prompt in person care if worsening, new symptoms arise, or if is not improving with treatment.   I discussed the assessment and treatment plan with the patient. The patient was provided an opportunity to ask questions and all were answered. The patient agreed with the plan and demonstrated an understanding of the instructions.   The patient was advised to call back or seek an in-person evaluation if the symptoms worsen or if the condition fails to improve as anticipated.  Time spent 20 min Delorise Jackson, MD

## 2019-04-09 NOTE — Patient Instructions (Signed)
Montelukast oral tablets What is this medicine? MONTELUKAST (mon te LOO kast) is used to prevent and treat the symptoms of asthma. It is also used to treat allergies. Do not use for an acute asthma attack. This medicine may be used for other purposes; ask your health care provider or pharmacist if you have questions. COMMON BRAND NAME(S): Singulair What should I tell my health care provider before I take this medicine? They need to know if you have any of these conditions:  liver disease  an unusual or allergic reaction to montelukast, other medicines, foods, dyes, or preservatives  pregnant or trying to get pregnant  breast-feeding How should I use this medicine? This medicine should be given by mouth. Follow the directions on the prescription label. Take this medicine at the same time every day. You may take this medicine with or without meals. Do not chew the tablets. Do not stop taking your medicine unless your doctor tells you to. Talk to your pediatrician regarding the use of this medicine in children. Special care may be needed. While this drug may be prescribed for children as young as 48 years of age for selected conditions, precautions do apply. Overdosage: If you think you have taken too much of this medicine contact a poison control center or emergency room at once. NOTE: This medicine is only for you. Do not share this medicine with others. What if I miss a dose? If you miss a dose, skip it. Take your next dose at the normal time. Do not take extra or 2 doses at the same time to make up for the missed dose. What may interact with this medicine?  anti-infectives like rifampin and rifabutin  medicines for seizures like phenytoin, phenobarbital, and carbamazepine This list may not describe all possible interactions. Give your health care provider a list of all the medicines, herbs, non-prescription drugs, or dietary supplements you use. Also tell them if you smoke, drink alcohol,  or use illegal drugs. Some items may interact with your medicine. What should I watch for while using this medicine? Visit your doctor or health care professional for regular checks on your progress. Tell your doctor or health care professional if your allergy or asthma symptoms do not improve. Take your medicine even when you do not have symptoms. Do not stop taking any of your medicine(s) unless your doctor tells you to. If you have asthma, talk to your doctor about what to do in an acute asthma attack. Always have your inhaled rescue medicine for asthma attacks with you. Patients and their families should watch for new or worsening thoughts of suicide or depression. Also watch for sudden changes in feelings such as feeling anxious, agitated, panicky, irritable, hostile, aggressive, impulsive, severely restless, overly excited and hyperactive, or not being able to sleep. Any worsening of mood or thoughts of suicide or dying should be reported to your health care professional right away. What side effects may I notice from receiving this medicine? Side effects that you should report to your doctor or health care professional as soon as possible:  allergic reactions like skin rash or hives, or swelling of the face, lips, or tongue  breathing problems  changes in emotions or moods  confusion  depressed mood  fever or infection  hallucinations  joint pain  painful lumps under the skin  pain, tingling, numbness in the hands or feet  redness, blistering, peeling, or loosening of the skin, including inside the mouth  restlessness  seizures  sleep  walking  signs and symptoms of infection like fever; chills; cough; sore throat; flu-like illness  signs and symptoms of liver injury like dark yellow or brown urine; general ill feeling or flu-like symptoms; light-colored stools; loss of appetite; nausea; right upper belly pain; unusually weak or tired; yellowing of the eyes or  skin  sinus pain or swelling  stuttering  suicidal thoughts or other mood changes  tremors  trouble sleeping  uncontrolled muscle movements  unusual bleeding or bruising  vivid or bad dreams Side effects that usually do not require medical attention (report to your doctor or health care professional if they continue or are bothersome):  dizziness  drowsiness  headache  runny nose  stomach upset  tiredness This list may not describe all possible side effects. Call your doctor for medical advice about side effects. You may report side effects to FDA at 1-800-FDA-1088. Where should I keep my medicine? Keep out of the reach of children. Store at room temperature between 15 and 30 degrees C (59 and 86 degrees F). Protect from light and moisture. Keep this medicine in the original bottle. Throw away any unused medicine after the expiration date. NOTE: This sheet is a summary. It may not cover all possible information. If you have questions about this medicine, talk to your doctor, pharmacist, or health care provider.  2020 Elsevier/Gold Standard (2018-06-19 12:54:33)  Budesonide; Formoterol Inhalation What is this medicine? BUDESONIDE; FORMOTEROL (byoo DES oh nide; for [place] te rol) inhalation is a combination of 2 medicines that decrease inflammation and help to open up the airways in your lungs. It is used to treat asthma. It is also used to treat chronic obstructive pulmonary disease (COPD), including chronic bronchitis or emphysema. Do NOT use for an acute asthma or COPD attack. This medicine may be used for other purposes; ask your health care provider or pharmacist if you have questions. COMMON BRAND NAME(S): Symbicort What should I tell my health care provider before I take this medicine? They need to know if you have any of these conditions:  bone problems  diabetes  eye disease, vision problems  heart disease  high blood pressure  history of irregular  heartbeat  immune system problems  infection  liver disease  pheochromocytoma  seizures  thyroid disease  an unusual or allergic reaction to budesonide, formoterol, other medicines, foods, dyes, or preservatives  pregnant or trying to get pregnant  breast-feeding How should I use this medicine? This medicine is inhaled through the mouth. Rinse your mouth with water after use. Make sure not to swallow the water. Follow the directions on your prescription label. Do not use more often than directed. Do not stop taking except on your doctor's advice. Make sure that you are using your inhaler correctly. Ask your doctor or health care provider if you have any questions. A special MedGuide will be given to you by the pharmacist with each prescription and refill. Be sure to read this information carefully each time. Talk to your pediatrician regarding the use of this medicine in children. While this drug may be prescribed for children as young as 47 years of age for selected conditions, precautions do apply. Overdosage: If you think you have taken too much of this medicine contact a poison control center or emergency room at once. NOTE: This medicine is only for you. Do not share this medicine with others. What if I miss a dose? If you miss a dose, use it as soon as you can. If  it is almost time for your next dose, use only that dose. Do not use double or extra doses. What may interact with this medicine? Do not take the medicine with any of the following medications:  cisapride  dofetilide  dronedarone  MAOIs like Marplan, Nardil, and Parnate  other medicines that contain long-acting beta-2 agonists (LABAs) like arfomoterol, formoterol, indacaterol, olodaterol, salmeterol, vilanterol  pimozide  procarbazine  thioridazine This medicine may also interact with the following medications:  certain antibiotics like clarithromycin, telithromycin  certain antivirals for HIV or  hepatitis  certain heart medicines like atenolol, metoprolol  certain medicines for blood pressure, heart disease, irregular heartbeat  certain medicines for depression, anxiety, or psychotic disturbances  certain medicines for fungal infections like ketoconazole, itraconazole  diuretics  grapefruit juice  mifepristone  other medicines that prolong the QT interval (an abnormal heart rhythm)  some vaccines  steroid medicines like prednisone or cortisone  stimulant medicines for attention disorders, weight loss, or to stay awake  theophylline This list may not describe all possible interactions. Give your health care provider a list of all the medicines, herbs, non-prescription drugs, or dietary supplements you use. Also tell them if you smoke, drink alcohol, or use illegal drugs. Some items may interact with your medicine. What should I watch for while using this medicine? Visit your health care professional for regular checks on your progress. Tell your health care professional if your symptoms do not start to get better or if they get worse. If your symptoms get worse or if you need your short-acting inhalers more often, call your doctor right away. This medicine may increase your risk of getting an infection. Tell your doctor or health care professional if you are around anyone with measles or chickenpox, or if you develop sores or blisters that do not heal properly. What side effects may I notice from receiving this medicine? Side effects that you should report to your doctor or health care professional as soon as possible:  allergic reactions like skin rash, itching or hives, swelling of the face, lips, or tongue  anxious  breathing problems  changes in vision, eye pain  muscle cramps or muscle pain  signs and symptoms of a dangerous change in heartbeat or heart rhythm like chest pain; dizziness; fast or irregular heartbeat; palpitations; feeling faint or lightheaded,  falls; breathing problems  signs and symptoms of high blood sugar such as being more thirsty or hungry or having to urinate more than normal. You may also feel very tired or have blurry vision  signs and symptoms of infection like fever; chills; cough; sore throat; pain or trouble passing urine  tremors  unusually weak or tired  white patches in the mouth or mouth sores Side effects that usually do not require medical attention (report these to your doctor or health care professional if they continue or are bothersome):  back pain  changes in taste  cough  diarrhea  runny or stuffy nose  upset stomach This list may not describe all possible side effects. Call your doctor for medical advice about side effects. You may report side effects to FDA at 1-800-FDA-1088. Where should I keep my medicine? Keep out of the reach of children. Store in a dry place at room temperature between 20 and 25 degrees C (68 and 77 degrees F). Do not get the inhaler wet. Keep track of the number of doses used. Throw away the inhaler after using the marked number of inhalations or after the expiration  date, whichever comes first. Do not burn or puncture canister. NOTE: This sheet is a summary. It may not cover all possible information. If you have questions about this medicine, talk to your doctor, pharmacist, or health care provider.  2020 Elsevier/Gold Standard (2018-10-05 15:50:03)

## 2019-04-26 ENCOUNTER — Ambulatory Visit: Payer: BC Managed Care – PPO | Admitting: Family

## 2019-05-08 ENCOUNTER — Other Ambulatory Visit: Payer: Self-pay

## 2019-05-08 ENCOUNTER — Ambulatory Visit: Payer: BC Managed Care – PPO | Attending: Internal Medicine

## 2019-05-08 DIAGNOSIS — Z23 Encounter for immunization: Secondary | ICD-10-CM | POA: Insufficient documentation

## 2019-05-08 NOTE — Progress Notes (Signed)
   Covid-19 Vaccination Clinic  Name:  Crystal Leonard    MRN: [id number] DOB: [birth year]  05/08/2019  Crystal Leonard was observed post Covid-19 immunization for 15 minutes without incident. She was provided with Vaccine Information Sheet and instruction to access the V-Safe system.   Crystal Leonard was instructed to call 911 with any severe reactions post vaccine: Marland Kitchen Difficulty breathing  . Swelling of face and throat  . A fast heartbeat  . A bad rash all over body  . Dizziness and weakness   Immunizations Administered    Name Date Dose VIS Date Route   Pfizer COVID-19 Vaccine 05/08/2019 10:43 AM 0.3 mL 02/12/2019 Intramuscular   Manufacturer: [place]   Lot: [id number]   [place]: [id number]

## 2019-05-10 ENCOUNTER — Telehealth: Payer: Self-pay | Admitting: Internal Medicine

## 2019-05-10 ENCOUNTER — Other Ambulatory Visit: Payer: Self-pay

## 2019-05-10 DIAGNOSIS — J9801 Acute bronchospasm: Secondary | ICD-10-CM

## 2019-05-10 DIAGNOSIS — R059 Cough, unspecified: Secondary | ICD-10-CM

## 2019-05-10 DIAGNOSIS — R05 Cough: Secondary | ICD-10-CM

## 2019-05-10 MED ORDER — BUDESONIDE-FORMOTEROL FUMARATE 80-4.5 MCG/ACT IN AERO: 2.0000 | INHALATION_SPRAY | Freq: Two times a day (BID) | RESPIRATORY_TRACT | 0 refills | Status: DC

## 2019-05-10 NOTE — Telephone Encounter (Signed)
I have refilled 

## 2019-05-10 NOTE — Telephone Encounter (Signed)
Wrong box  Needing refill of symbicort 80-4.5 [facility]   [facility]

## 2019-05-14 ENCOUNTER — Ambulatory Visit (INDEPENDENT_AMBULATORY_CARE_PROVIDER_SITE_OTHER)
Admission: RE | Admit: 2019-05-14 | Discharge: 2019-05-14 | Disposition: A | Discharge status: Home / Self Care | Payer: BC Managed Care – PPO | Source: Ambulatory Visit

## 2019-05-14 DIAGNOSIS — R059 Cough, unspecified: Secondary | ICD-10-CM

## 2019-05-14 DIAGNOSIS — R05 Cough: Secondary | ICD-10-CM

## 2019-05-14 NOTE — Discharge Instructions (Signed)
Take Tylenol or ibuprofen as needed for fever or discomfort.  Rest and keep yourself hydrated.    Come here to be seen in person or go to the emergency department if you develop high fever, shortness of breath, severe diarrhea, or other concerning symptoms.

## 2019-05-14 NOTE — ED Provider Notes (Signed)
Virtual Visit via Video Note:  Crystal Leonard  initiated request for Telemedicine visit with [facility] team. I connected with Crystal Leonard  on 05/14/2019 at 11:23 AM  for a synchronized telemedicine visit using a video enabled HIPPA compliant telemedicine application. I verified that I am speaking with Crystal Leonard  using two identifiers. [name], NP  was physically located in a [facility] Urgent care site and KESHANDA FRESHLEY was located at a different location.   The limitations of evaluation and management by telemedicine as well as the availability of in-person appointments were discussed. Patient was informed that she  may incur a bill ( including co-pay) for this virtual visit encounter. [name]  expressed understanding and gave verbal consent to proceed with virtual visit.     History of Present Illness:Crystal Leonard  is a 49 y.o. female presents for evaluation of 3-4 day history of fever, body aches, chills, nonproductive cough, headache, "hurts to take a deep breath", and sore throat.  Tmax 100.6.  She had her first COVID vaccine 05/08/2019.  She denies ear pain, shortness of breath, vomiting, diarrhea, rash, or other symptoms.  She has attempted treatment at home with OTC sinus medication and ibuprofen.  She was COVID positive in July.     Allergies  Allergen Reactions  . Tetanus Toxoids Nausea And Vomiting and Other (See Comments)    Syncope      Past Medical History:  Diagnosis Date  . Chicken pox   . Depression   . Headache    Frequent headaches  . Hypertension   . Kidney stones   . Migraine   . UTI (lower urinary tract infection)      Social History   Tobacco Use  . Smoking status: Current Every Day Smoker    Packs/day: 1.00    Types: Cigarettes  . Smokeless tobacco: Never Used  Substance Use Topics  . Alcohol use: Yes    Alcohol/week: 0.0 standard drinks    Comment: Rare occasions   . Drug use: No    ROS: as stated in  HPI.  All other systems reviewed and negative.      Observations/Objective: Physical Exam  VITALS: Tmax 100.6 per patient GENERAL: Alert, appears well and in no acute distress. HEENT: Atraumatic. NECK: Normal movements of the head and neck. CARDIOPULMONARY: No increased WOB. Speaking in clear sentences. I:E ratio WNL.  MS: Moves all visible extremities without noticeable abnormality. PSYCH: Pleasant and cooperative, well-groomed. Speech normal rate and rhythm. Affect is appropriate. Insight and judgement are appropriate. Attention is focused, linear, and appropriate.  NEURO: CN grossly intact. Oriented as arrived to appointment on time with no prompting. Moves both UE equally.  SKIN: No obvious lesions, wounds, erythema, or cyanosis noted on face or hands.   Assessment and Plan:    ICD-10-CM   1. Cough  R05        Follow Up Instructions: Discussed with patient that her symptoms are likely due to her body's immune response to the COVID vaccine. Discussed symptomatic treatment with Tylenol or ibuprofen, rest, hydration. Instructed her to come here to be seen in person or go to the emergency department if she develops high fever that does not improve with medication, worsening cough, shortness of breath, severe vomiting or diarrhea, or other concerning symptoms. Patient agrees to plan of care.    I discussed the assessment and treatment plan with the patient. The patient was provided an  opportunity to ask questions and all were answered. The patient agreed with the plan and demonstrated an understanding of the instructions.   The patient was advised to call back or seek an in-person evaluation if the symptoms worsen or if the condition fails to improve as anticipated.      [name], NP  05/14/2019 11:23 AM         [name], NP 05/14/19 1125

## 2019-05-25 ENCOUNTER — Encounter: Payer: Self-pay | Admitting: Family

## 2019-06-01 ENCOUNTER — Ambulatory Visit: Payer: BC Managed Care – PPO | Attending: Internal Medicine

## 2019-06-01 DIAGNOSIS — Z23 Encounter for immunization: Secondary | ICD-10-CM

## 2019-06-01 NOTE — Progress Notes (Signed)
   Covid-19 Vaccination Clinic  Name:  Crystal Leonard    MRN: [id number] DOB: [birth year]  06/01/2019  [name] was observed post Covid-19 immunization for 15 minutes without incident. She was provided with Vaccine Information Sheet and instruction to access the V-Safe system.   [name] was instructed to call 911 with any severe reactions post vaccine: Marland Kitchen Difficulty breathing  . Swelling of face and throat  . A fast heartbeat  . A bad rash all over body  . Dizziness and weakness   Immunizations Administered    Name Date Dose VIS Date Route   Pfizer COVID-19 Vaccine 06/01/2019  8:39 AM 0.3 mL 02/12/2019 Intramuscular   Manufacturer: [place]   Lot: [id number]   [place]: [id number]

## 2019-06-14 ENCOUNTER — Other Ambulatory Visit: Payer: Self-pay | Admitting: Family

## 2019-06-14 DIAGNOSIS — R05 Cough: Secondary | ICD-10-CM

## 2019-06-14 DIAGNOSIS — J9801 Acute bronchospasm: Secondary | ICD-10-CM

## 2019-06-14 DIAGNOSIS — R059 Cough, unspecified: Secondary | ICD-10-CM

## 2019-06-27 ENCOUNTER — Other Ambulatory Visit: Payer: Self-pay | Admitting: Family

## 2019-06-27 DIAGNOSIS — R059 Cough, unspecified: Secondary | ICD-10-CM

## 2019-06-27 DIAGNOSIS — J9801 Acute bronchospasm: Secondary | ICD-10-CM

## 2019-06-27 DIAGNOSIS — R05 Cough: Secondary | ICD-10-CM

## 2019-09-15 ENCOUNTER — Encounter: Payer: Self-pay | Admitting: Family

## 2019-09-15 ENCOUNTER — Telehealth (INDEPENDENT_AMBULATORY_CARE_PROVIDER_SITE_OTHER): Payer: BC Managed Care – PPO | Admitting: Family

## 2019-09-15 VITALS — Ht 65.98 in | Wt 250.0 lb

## 2019-09-15 DIAGNOSIS — J329 Chronic sinusitis, unspecified: Secondary | ICD-10-CM

## 2019-09-15 DIAGNOSIS — I10 Essential (primary) hypertension: Secondary | ICD-10-CM

## 2019-09-15 DIAGNOSIS — B9689 Other specified bacterial agents as the cause of diseases classified elsewhere: Secondary | ICD-10-CM | POA: Diagnosis not present

## 2019-09-15 MED ORDER — AMOXICILLIN-POT CLAVULANATE 875-125 MG PO TABS: 1.0000 | ORAL_TABLET | Freq: Two times a day (BID) | ORAL | 0 refills | Status: AC

## 2019-09-15 NOTE — Assessment & Plan Note (Signed)
No recent blood pressure readings. Reiterated the importance of follow up every 6 months for lab, BP monitoring for safety particularly has she is on diuretic. She verbalized understanding and will make in person follow up with me

## 2019-09-15 NOTE — Progress Notes (Signed)
Virtual Visit via Video Note  I connected with@  on 09/15/19 at 10:30 AM EDT by a video enabled telemedicine application and verified that I am speaking with the correct person using two identifiers.  Location patient: home Location provider:work  Persons participating in the virtual visit: patient, provider  I discussed the limitations of evaluation and management by telemedicine and the availability of in person appointments. The patient expressed understanding and agreed to proceed.   HPI:  Acute visit Complains of headache, sinus pressure x 4 days, unchanged.   HA frontal above both eyes and also  back of neck, unchanged. This is not worse Ha of life. No neck stiffness. Able to move neck freely without pain, discomfort.  Presentation started with HA 3 days ago. Endorses sinus pressure, nausea, chills, occasional cough more present at night. Cannot get congestion out of nose though feels it is there due to facial pain.    No vomiting,  Abdominal pain, dysuria, sob, loss of taste and smell, palpitations, fever, sore throat, ear pain, facial swelling, leg swelling.   Took tylenol sinus without relief.taking mucinex dm with some relief of cough.  Takes claritin.   Smoker- 20+ years.    Prescribed symbicort due to sequelae of covid. Not using symbicort. Will use albuterol 1-2 per week when she has a cough. Denies daily coughing aside from current illness.   Had been at beach last week and went to work at [employer] to work 3 days ago.  No known covid exposure. Lives with husband.   COVID vaccinated. No longer wearing mask.  Had COVID last year with symptoms being SOB, palpitations,fever.  H/o migraine- states 2-3 migraines 'in whole life. '   HTN- not checking blood pressure. Compliant with HCTZ  ROS: See pertinent positives and negatives per HPI.  Past Medical History:  Diagnosis Date  . Chicken pox   . Depression   . Headache    Frequent headaches  . Hypertension   .  Kidney stones   . Migraine   . UTI (lower urinary tract infection)     Past Surgical History:  Procedure Laterality Date  . ABDOMINAL HYSTERECTOMY  2012  . LAPAROSCOPIC ENDOMETRIOSIS FULGURATION    . TONSILLECTOMY AND ADENOIDECTOMY      Family History  Problem Relation Age of Onset  . Hypertension Mother   . Cancer Father        prostate  . Hypertension Maternal Grandmother   . Diabetes Brother   . Diabetes Maternal Aunt        Current Outpatient Medications:  .  albuterol (PROVENTIL HFA) 108 (90 Base) MCG/ACT inhaler, Inhale 2 puffs into the lungs every 6 (six) hours as needed for wheezing or shortness of breath., Disp: 18 g, Rfl: 2 .  citalopram (CELEXA) 20 MG tablet, Take 20 mg by mouth daily., Disp: , Rfl:  .  clonazePAM (KLONOPIN) 0.5 MG tablet, Take by mouth daily as needed., Disp: , Rfl:  .  DULoxetine (CYMBALTA) 60 MG capsule, Take 60 mg by mouth daily., Disp: , Rfl:  .  hydrochlorothiazide (HYDRODIURIL) 12.5 MG tablet, TAKE 1 TABLET BY MOUTH EVERY DAY. NEEDS FOLLOW-UP APPT, Disp: 90 tablet, Rfl: 1 .  Multiple Vitamin (MULTIVITAMIN) tablet, Take 1 tablet by mouth daily., Disp: , Rfl:  .  omeprazole (PRILOSEC) 20 MG capsule, Take 20 mg by mouth as needed., Disp: , Rfl:  .  SYMBICORT 80-4.5 MCG/ACT inhaler, INHALE 2 PUFFS INTO THE LUNGS 2 (TWO) TIMES DAILY. RINSE MOUTH, Disp: 30.6  Inhaler, Rfl: 1 .  amoxicillin-clavulanate (AUGMENTIN) 875-125 MG tablet, Take 1 tablet by mouth 2 (two) times daily for 7 days., Disp: 14 tablet, Rfl: 0  EXAM:  VITALS per patient if applicable:  GENERAL: alert, oriented, appears well and in no acute distress  HEENT: atraumatic, conjunttiva clear, no obvious abnormalities on inspection of external nose and ears. No erythema, edema of face appreciated.   NECK: normal movements of the head and neck. Able to put chin to chest without pain. Able to move neck from right to left.   LUNGS: on inspection no signs of respiratory distress,  breathing rate appears normal, no obvious gross SOB, gasping or wheezing  CV: no obvious cyanosis  MS: moves all visible extremities without noticeable abnormality  PSYCH/NEURO: pleasant and cooperative, no obvious depression or anxiety, speech and thought processing grossly intact  ASSESSMENT AND PLAN:  Discussed the following assessment and plan:  Bacterial sinusitis - Plan: amoxicillin-clavulanate (AUGMENTIN) 875-125 MG tablet  Essential hypertension Problem List Items Addressed This Visit      Cardiovascular and Mediastinum   HTN (hypertension)    No recent blood pressure readings. Reiterated the importance of follow up every 6 months for lab, BP monitoring for safety particularly has she is on diuretic. She verbalized understanding and will make in person follow up with me         Respiratory   Bacterial sinusitis - Primary    Moving head freely, without pain over video. Patient nontoxic in appearance and afebrile. Based on severity of facial pain , frontal and occipital HA, we agreed to treat with antibiotics for suspected bacterial sinusitis. She will start probiotic. Advised for her to get covid tested. She will let me know after starting antibiotic if symptoms do not rapidly improve. She understands with limitations of virtual medicine, if she has any new symptoms, worsening symptoms she will seek in person evaluation at urgent care or ED.       Relevant Medications   amoxicillin-clavulanate (AUGMENTIN) 875-125 MG tablet      -we discussed possible serious and likely etiologies, options for evaluation and workup, limitations of telemedicine visit vs in person visit, treatment, treatment risks and precautions. Pt prefers to treat via telemedicine empirically rather then risking or undertaking an in person visit at this moment. Patient agrees to seek prompt in person care if worsening, new symptoms arise, or if is not improving with treatment.   I discussed the assessment  and treatment plan with the patient. The patient was provided an opportunity to ask questions and all were answered. The patient agreed with the plan and demonstrated an understanding of the instructions.   The patient was advised to call back or seek an in-person evaluation if the symptoms worsen or if the condition fails to improve as anticipated.   Mable Paris, FNP

## 2019-09-15 NOTE — Assessment & Plan Note (Addendum)
Moving head freely, without pain over video. Patient nontoxic in appearance and afebrile. Based on severity of facial pain , frontal and occipital HA, we agreed to treat with antibiotics for suspected bacterial sinusitis. She will start probiotic. Advised for her to get covid tested. She will let me know after starting antibiotic if symptoms do not rapidly improve. She understands with limitations of virtual medicine, if she has any new symptoms, worsening symptoms she will seek in person evaluation at urgent care or ED.

## 2019-09-16 ENCOUNTER — Telehealth: Payer: Self-pay | Admitting: Family

## 2019-09-16 ENCOUNTER — Ambulatory Visit: Payer: BC Managed Care – PPO | Attending: Internal Medicine

## 2019-09-16 NOTE — Telephone Encounter (Signed)
Call patient and make appointment 6 month follow up.

## 2019-09-17 LAB — SARS-COV-2, NAA 2 DAY TAT

## 2019-09-17 LAB — NOVEL CORONAVIRUS, NAA: SARS-CoV-2, NAA: NOT DETECTED

## 2019-09-17 NOTE — Progress Notes (Signed)
Crystal Leonard states she is doing a little better. She is still feeling aches and has some headaches. She states she received a covid test at the [facility] and it was negative.

## 2019-09-22 ENCOUNTER — Ambulatory Visit: Payer: BC Managed Care – PPO | Admitting: Internal Medicine

## 2019-09-27 NOTE — Telephone Encounter (Signed)
Patient scheduled 03/07/20 @ 8:30. She was asking about blood work. I let her know I would check to see if she needed blood work before her appointment. Please advise.

## 2019-09-27 NOTE — Telephone Encounter (Signed)
Advise that we will discuss blood work at time of visit in 2022  However she never had labs done ordered from 04/2019 If she would like fasting labs done this year, please schedule in the next 1-2 weeks

## 2019-09-27 NOTE — Telephone Encounter (Signed)
lvm pt needs 6 month follow up and not 8 week follow up.

## 2019-09-28 NOTE — Telephone Encounter (Signed)
Patient returned call and verbalized understanding. Scheduled for fasting labs on 8.10.21.

## 2019-09-28 NOTE — Telephone Encounter (Signed)
Left a message to call back.

## 2019-10-12 ENCOUNTER — Other Ambulatory Visit: Payer: Self-pay

## 2019-10-12 ENCOUNTER — Encounter: Payer: Self-pay | Admitting: Family

## 2019-10-12 ENCOUNTER — Other Ambulatory Visit (INDEPENDENT_AMBULATORY_CARE_PROVIDER_SITE_OTHER): Payer: BC Managed Care – PPO

## 2019-10-12 DIAGNOSIS — E559 Vitamin D deficiency, unspecified: Secondary | ICD-10-CM | POA: Diagnosis not present

## 2019-10-12 DIAGNOSIS — R7303 Prediabetes: Secondary | ICD-10-CM

## 2019-10-12 DIAGNOSIS — R319 Hematuria, unspecified: Secondary | ICD-10-CM

## 2019-10-12 DIAGNOSIS — I10 Essential (primary) hypertension: Secondary | ICD-10-CM

## 2019-10-12 DIAGNOSIS — Z1329 Encounter for screening for other suspected endocrine disorder: Secondary | ICD-10-CM | POA: Diagnosis not present

## 2019-10-12 LAB — COMPREHENSIVE METABOLIC PANEL
ALT: 16 U/L (ref 0–35)
AST: 13 U/L (ref 0–37)
Albumin: 4.3 g/dL (ref 3.5–5.2)
Alkaline Phosphatase: 72 U/L (ref 39–117)
BUN: 19 mg/dL (ref 6–23)
CO2: 29 mEq/L (ref 19–32)
Calcium: 10 mg/dL (ref 8.4–10.5)
Chloride: 103 mEq/L (ref 96–112)
Creatinine, Ser: 0.92 mg/dL (ref 0.40–1.20)
GFR: 65.24 mL/min (ref >60.00)
Glucose, Bld: 94 mg/dL (ref 70–99)
Potassium: 4 mEq/L (ref 3.5–5.1)
Sodium: 140 mEq/L (ref 135–145)
Total Bilirubin: 0.4 mg/dL (ref 0.2–1.2)
Total Protein: 7.3 g/dL (ref 6.0–8.3)

## 2019-10-12 LAB — CBC WITH DIFFERENTIAL/PLATELET
Basophils Absolute: 0.1 10*3/uL (ref 0.0–0.1)
Basophils Relative: 0.6 % (ref 0.0–3.0)
Eosinophils Absolute: 0.2 10*3/uL (ref 0.0–0.7)
Eosinophils Relative: 2.1 % (ref 0.0–5.0)
HCT: 44.7 % (ref 36.0–46.0)
Hemoglobin: 15.2 g/dL — ABNORMAL HIGH (ref 12.0–15.0)
Lymphocytes Relative: 32.8 % (ref 12.0–46.0)
Lymphs Abs: 3.1 10*3/uL (ref 0.7–4.0)
MCHC: 34.1 g/dL (ref 30.0–36.0)
MCV: 88.4 fl (ref 78.0–100.0)
Monocytes Absolute: 0.5 10*3/uL (ref 0.1–1.0)
Monocytes Relative: 5.5 % (ref 3.0–12.0)
Neutro Abs: 5.5 10*3/uL (ref 1.4–7.7)
Neutrophils Relative %: 59 % (ref 43.0–77.0)
Platelets: 390 10*3/uL (ref 150.0–400.0)
RBC: 5.05 Mil/uL (ref 3.87–5.11)
RDW: 14 % (ref 11.5–15.5)
WBC: 9.3 10*3/uL (ref 4.0–10.5)

## 2019-10-12 LAB — LIPID PANEL
Cholesterol: 198 mg/dL (ref 0–200)
HDL: 42.1 mg/dL (ref >39.00)
LDL Cholesterol: 125 mg/dL — ABNORMAL HIGH (ref 0–99)
NonHDL: 156.02
Total CHOL/HDL Ratio: 5
Triglycerides: 154 mg/dL — ABNORMAL HIGH (ref 0.0–149.0)
VLDL: 30.8 mg/dL (ref 0.0–40.0)

## 2019-10-12 LAB — TSH: TSH: 1.43 u[IU]/mL (ref 0.35–4.50)

## 2019-10-12 LAB — VITAMIN D 25 HYDROXY (VIT D DEFICIENCY, FRACTURES): VITD: 36.39 ng/mL (ref 30.00–100.00)

## 2019-10-12 LAB — HEMOGLOBIN A1C: Hgb A1c MFr Bld: 6.5 % (ref 4.6–6.5)

## 2019-10-13 ENCOUNTER — Other Ambulatory Visit: Payer: Self-pay | Admitting: Family

## 2019-10-13 ENCOUNTER — Telehealth: Payer: Self-pay | Admitting: Family

## 2019-10-13 DIAGNOSIS — R0683 Snoring: Secondary | ICD-10-CM

## 2019-10-13 LAB — URINALYSIS, ROUTINE W REFLEX MICROSCOPIC
Bilirubin Urine: NEGATIVE
Glucose, UA: NEGATIVE
Hgb urine dipstick: NEGATIVE
Ketones, ur: NEGATIVE
Leukocytes,Ua: NEGATIVE
Nitrite: NEGATIVE
Protein, ur: NEGATIVE
Specific Gravity, Urine: 1.004 (ref 1.001–1.03)
pH: 7 (ref 5.0–8.0)

## 2019-10-13 LAB — URINE CULTURE
MICRO NUMBER:: 10808326
SPECIMEN QUALITY:: ADEQUATE

## 2019-10-13 NOTE — Telephone Encounter (Signed)
Call pt As she discussed sleep apnea eval with dr Aundra Dubin I have gone ahead and referral to pulmonology for this  Let us know if you dont hear back within a week in regards to an appointment being scheduled.

## 2019-10-13 NOTE — Telephone Encounter (Signed)
Crystal Leonard verbalized understanding and had no further questions.

## 2019-11-02 ENCOUNTER — Telehealth: Payer: Self-pay | Admitting: Family

## 2019-11-02 DIAGNOSIS — I10 Essential (primary) hypertension: Secondary | ICD-10-CM

## 2019-11-02 MED ORDER — HYDROCHLOROTHIAZIDE 12.5 MG PO TABS: ORAL_TABLET | ORAL | 1 refills | Status: DC

## 2019-11-02 NOTE — Addendum Note (Signed)
Addended byElpidio Galea T on: 11/02/2019 12:24 PM   Modules accepted: Orders

## 2019-11-02 NOTE — Telephone Encounter (Signed)
Pt needs a refill on hydrochlorothiazide (HYDRODIURIL) 12.5 MG tablet sent to [facility]

## 2019-11-05 ENCOUNTER — Ambulatory Visit: Payer: BC Managed Care – PPO | Admitting: Family

## 2019-11-22 ENCOUNTER — Other Ambulatory Visit: Payer: Self-pay

## 2019-11-24 ENCOUNTER — Ambulatory Visit: Payer: BC Managed Care – PPO | Admitting: Family

## 2019-11-30 ENCOUNTER — Telehealth (INDEPENDENT_AMBULATORY_CARE_PROVIDER_SITE_OTHER): Payer: BC Managed Care – PPO | Admitting: Family

## 2019-11-30 ENCOUNTER — Encounter: Payer: Self-pay | Admitting: Family

## 2019-11-30 VITALS — Ht 66.0 in | Wt 250.0 lb

## 2019-11-30 DIAGNOSIS — R7303 Prediabetes: Secondary | ICD-10-CM

## 2019-11-30 DIAGNOSIS — I10 Essential (primary) hypertension: Secondary | ICD-10-CM

## 2019-11-30 NOTE — Patient Instructions (Signed)
Trial taking hctz qpm.  It is imperative that you are seen AT least twice per year for labs and monitoring. Monitor blood pressure at home and me 5-6 reading on separate days. Goal is less than 120/80, based on newest guidelines, however we certainly want to be less than 130/80;  if persistently higher, please make sooner follow up appointment so we can recheck you blood pressure and manage/ adjust medications.

## 2019-11-30 NOTE — Assessment & Plan Note (Signed)
Slightly elevated. Will change hctz to qpm and patient will work on weight loss . She will keep BP log and let me know if persistently elevated

## 2019-11-30 NOTE — Progress Notes (Signed)
Virtual Visit via Video Note  I connected with@  on 11/30/19 at 11:30 AM EDT by a video enabled telemedicine application and verified that I am speaking with the correct person using two identifiers.  Location patient: home Location provider:work  Persons participating in the virtual visit: patient, provider  I discussed the limitations of evaluation and management by telemedicine and the availability of in person appointments. The patient expressed understanding and agreed to proceed.   HPI: Feels well today No  New concerns.  New diagnosis DM. Has started working out with yoga and cardio and pursing weight loss. Interested in seeing a nutrition.   Has almost stopped smoking.   HTN- compliant with hctz 12.5mg . at home 129/93. No cp, sob. No daily nsaids.   Depression-Dr Nicolasa Ducking,  follows with psychiatry who prescribes celexa, cymbalta, klonopin.    ROS: See pertinent positives and negatives per HPI.    EXAM:  VITALS per patient if applicable: BP Readings from Last 3 Encounters:  10/07/18 130/90  05/13/18 136/90  05/09/17 130/88    GENERAL: alert, oriented, appears well and in no acute distress  HEENT: atraumatic, conjunttiva clear, no obvious abnormalities on inspection of external nose and ears  NECK: normal movements of the head and neck  LUNGS: on inspection no signs of respiratory distress, breathing rate appears normal, no obvious gross SOB, gasping or wheezing  CV: no obvious cyanosis  MS: moves all visible extremities without noticeable abnormality  PSYCH/NEURO: pleasant and cooperative, no obvious depression or anxiety, speech and thought processing grossly intact  ASSESSMENT AND PLAN:  Discussed the following assessment and plan:  Problem List Items Addressed This Visit      Cardiovascular and Mediastinum   HTN (hypertension) - Primary    Slightly elevated. Will change hctz to qpm and patient will work on weight loss . She will keep BP log and let  me know if persistently elevated        Other   Prediabetes    New diagnosis DM. Patient very determined to revert back to prediabetes.  Declines metformin. F/u 3 months.       Relevant Orders   Referral to Nutrition and Diabetes Services      -we discussed possible serious and likely etiologies, options for evaluation and workup, limitations of telemedicine visit vs in person visit, treatment, treatment risks and precautions. Pt prefers to treat via telemedicine empirically rather then risking or undertaking an in person visit at this moment.  .   I discussed the assessment and treatment plan with the patient. The patient was provided an opportunity to ask questions and all were answered. The patient agreed with the plan and demonstrated an understanding of the instructions.   The patient was advised to call back or seek an in-person evaluation if the symptoms worsen or if the condition fails to improve as anticipated.   Mable Paris, FNP

## 2019-11-30 NOTE — Assessment & Plan Note (Signed)
New diagnosis DM. Patient very determined to revert back to prediabetes.  Declines metformin. F/u 3 months.

## 2019-12-17 ENCOUNTER — Encounter: Payer: Self-pay | Admitting: Dietician

## 2019-12-17 ENCOUNTER — Encounter: Payer: BC Managed Care – PPO | Attending: Family | Admitting: Dietician

## 2019-12-17 ENCOUNTER — Other Ambulatory Visit: Payer: Self-pay

## 2019-12-17 VITALS — Ht 66.0 in | Wt 253.6 lb

## 2019-12-17 DIAGNOSIS — R7303 Prediabetes: Secondary | ICD-10-CM | POA: Diagnosis not present

## 2019-12-17 NOTE — Patient Instructions (Signed)
   Portion control carbs   2-3 servings at meals   1-2 servings at snacks   Listen to your hunger cues   At snacks make sure to pair a protein with your carbs

## 2019-12-17 NOTE — Progress Notes (Signed)
Medical Nutrition Therapy: Visit start time: 6294  end time: 1500  Assessment:  Diagnosis: prediabetes  Past medical history: HTN, GERD Psychosocial issues/ stress concerns: pt rates stress level as "moderate" and feels "ok" about stress management skills   Preferred learning method:  . Auditory . Visual . Hands-on  Current weight: 253.6 lbs  Height: 5'6" Medications, supplements: reconciled in medical record  Labs HgA1c 6.5(H) on 10/12/2019 Smokes 1/2ppd  Progress and evaluation:   Per NP progress note, pt is motivated to lower A1c back to prediabetes range; pt requested visit with dietitian   Pt reports she has tried many diets in the past including weight watchers, noom, and looked into keto  Pt has made many changes to her diet   No more regular soda   Cut out sweets (used to eat candy bars, candy daily)  Following a low carb diet now, misses eating bread    Pt states clothes are fitting better since recent changes, dress size 20 to 18  Physical activity: exercising at 430 am, 'beach body' low impact workout 2x/week, also started yoga a couple months ago   Dietary Intake:  Usual eating pattern includes 3 meals and 1 snacks per day. Dining out frequency: 9-12 meals per week.  Breakfast: eggs, cheese, sausage patties Snack: 3 pieces of salami, 1 oz swiss, pecans/pistachios Lunch: salad(cheese, lettuce) with chicken/steak/shrimp; leftovers  Snack: same as above  Supper: fish/chicken/beef with veggies  Snack: 2 double stuffed oreos  Beverages: water  Nutrition Care Education: Basic nutrition: basic food groups, appropriate nutrient balance, appropriate meal and snack schedule, general nutrition guidelines    Weight control: determining reasonable weight loss rate, importance of low sugar and low fat choices, portion control strategies Advanced nutrition:  cooking techniques, dining out, food label reading Diabetes:  goals for BGs, appropriate meal and snack schedule,  appropriate carb intake and balance, healthy carb choices, role of fiber, protein, fat Hypertension: identifying high sodium foods, identifying food sources of potassium, magnesium Hyperlipidemia:  target goals for lipids, healthy and unhealthy fats, role of fiber, plant sterols, role of exercise Other lifestyle changes:  Mindful eating, tracking progress   Nutritional Diagnosis:  NB-1.1 Food and nutrition-related knowledge deficit As related to new diagnosis of diabetes .  As evidenced by pt diet recall, pt questions and discussion.  Intervention:  Discussion and instruction as noted above.  Pt has made several changes to eating and lifestyle habits.  Pt reported she misses eating bread; reviewed how to enjoy bread and other carbs in moderation.  Also discussed mindful eating techniques like listening to hunger/fullness cues.   Answered questions about low carb/keto-style diets.    Increase fruit and vegetable intake   Incorporate vegetables at lunch and dinner   Incorporate more F/V at snacks  Try frozen veggies that come in microwaveable pouches (may be tender than fresh and low prep time)  Try canned fruit NOT in heavy syrup (mandarin oranges, peaches, pears, applesauce) for snacks + protein (cheese, PB)  Smoothies may be good for days without lunch break    Continue to limit sugar-sweetened beverage consumption   Switch from sweet to unsweet tea with sugar substitute   Switch from regular to diet soda  Drink at least 64oz water daily (add crystal light, lemon, or mio as needed)    Continue physical activity   150 minutes per week is recommendation   Try searching for additional 'low impact' exercise routines on YouTube   Increase fiber intake   Eat  at least 3 servings of whole grains a day  Increase fruit and vegetable intake  Increase fluid intake   Drink at least 64oz water daily   Decrease sodium intake   Reduce sodium intake to recommended 1500mg /day   Read  nutrition labels on packaged foods to monitor sodium intake   400-600 mg/meal  <200 mg/snack   Decrease fast food frequency   Avoid processed meats    Decrease saturated fat intake   Try more plant-based sources of protein  Limit processed meats   Switch to low fat dairy products   Education Materials given:  . General diet guidelines for Diabetes . Mindful eating- is it time to eat, hunger scale  . Carb-mindful smoothie  . Food record . Plate Planner with food lists . Goals/ instructions  Learner/ who was taught:  . Patient   Level of understanding: Marland Kitchen Verbalizes/ demonstrates competency  Demonstrated degree of understanding via:   Teach back Learning barriers: . None  Willingness to learn/ readiness for change: . Eager, change in progress  Monitoring and Evaluation:  Dietary intake, exercise, BGs and A1c, and body weight      follow up: 6-8 weeks, pt will call back to schedule

## 2020-01-25 ENCOUNTER — Ambulatory Visit: Payer: BC Managed Care – PPO | Admitting: Pulmonary Disease

## 2020-01-25 ENCOUNTER — Ambulatory Visit
Admission: RE | Admit: 2020-01-25 | Discharge: 2020-01-25 | Disposition: A | Discharge status: Home / Self Care | Payer: BC Managed Care – PPO | Source: Ambulatory Visit | Attending: Pulmonary Disease | Admitting: Pulmonary Disease

## 2020-01-25 ENCOUNTER — Encounter: Payer: Self-pay | Admitting: Pulmonary Disease

## 2020-01-25 ENCOUNTER — Other Ambulatory Visit: Payer: Self-pay

## 2020-01-25 VITALS — BP 132/76 | HR 81 | Temp 97.7°F | Ht 66.0 in | Wt 255.2 lb

## 2020-01-25 DIAGNOSIS — Z9189 Other specified personal risk factors, not elsewhere classified: Secondary | ICD-10-CM | POA: Diagnosis not present

## 2020-01-25 DIAGNOSIS — F17200 Nicotine dependence, unspecified, uncomplicated: Secondary | ICD-10-CM

## 2020-01-25 DIAGNOSIS — Z8616 Personal history of COVID-19: Secondary | ICD-10-CM

## 2020-01-25 DIAGNOSIS — Z0000 Encounter for general adult medical examination without abnormal findings: Secondary | ICD-10-CM | POA: Insufficient documentation

## 2020-01-25 DIAGNOSIS — F17210 Nicotine dependence, cigarettes, uncomplicated: Secondary | ICD-10-CM

## 2020-01-25 DIAGNOSIS — G4733 Obstructive sleep apnea (adult) (pediatric): Secondary | ICD-10-CM | POA: Insufficient documentation

## 2020-01-25 DIAGNOSIS — Z23 Encounter for immunization: Secondary | ICD-10-CM

## 2020-01-25 NOTE — Patient Instructions (Addendum)
You were seen today by Lauraine Rinne, NP  for:   1. At risk for obstructive sleep apnea  - Home sleep test; Future  2. Tobacco use disorder  Keep up your hard work with decreasing your smoking next If you become interested in additional smoking cessation methods such as nicotine replacement therapies or Chantix please let us know  We recommend that you stop smoking.  >>>You need to set a quit date >>>If you have friends or family who smoke, let them know you are trying to quit and not to smoke around you or in your living environment  Smoking Cessation Resources:  1 800 QUIT NOW  >>> Patient to call this resource and utilize it to help support her quit smoking >>> Keep up your hard work with stopping smoking  You can also contact the [facility] >>>For smoking cessation classes call [phone]  We do not recommend using e-cigarettes as a form of stopping smoking  You can sign up for smoking cessation support texts and information:  >>>https://smokefree.gov/smokefreetxt    3. Healthcare maintenance  Seasonal flu vaccine today  Recommend discussing with primary care or at next office visit here obtaining the Pneumovax 23 as you're an active smoker  4. History of COVID-19  - DG Chest 2 View; Future  Recommend that you obtain the COVID-19 booster  We recommend today:  Orders Placed This Encounter  Procedures  . DG Chest 2 View    Standing Status:   Future    Standing Expiration Date:   05/24/2020    Order Specific Question:   Reason for Exam (SYMPTOM  OR DIAGNOSIS REQUIRED)    Answer:   smoker, post covid in 10/2018    Order Specific Question:   Preferred imaging location?    Answer:   [facility]    Order Specific Question:   Radiology Contrast Protocol - do NOT remove file path    Answer:   [email or web address]  . Home sleep test    Standing Status:   Future    Standing Expiration Date:    01/24/2021    Order Specific Question:   Where should this test be performed:    Answer:   [facility]   Orders Placed This Encounter  Procedures  . DG Chest 2 View  . Home sleep test   No orders of the defined types were placed in this encounter.   Follow Up:    Return in about 3 months (around 04/26/2020), or if symptoms worsen or fail to improve, for [facility], Follow up with Wyn Quaker FNP-C.   Notification of test results are managed in the following manner: If there are  any recommendations or changes to the  plan of care discussed in office today,  we will contact you and let you know what they are. If you do not hear from [place], then your results are normal and you can view them through your  MyChart account , or a letter will be sent to you. Thank you again for trusting [place] with your care  - Thank you, [facility]    It is flu season:   >>> Best ways to protect herself from the flu: Receive the yearly flu vaccine, practice good hand hygiene washing with soap and also using hand sanitizer when available, eat a nutritious meals, get adequate rest, hydrate appropriately       Please contact the office if your symptoms worsen or you have concerns  that you are not improving.   Thank you for choosing [facility] for your healthcare, and for allowing [place] to partner with you on your healthcare journey. I am thankful to be able to provide care to you today.   Wyn Quaker FNP-C   Sleep Apnea Sleep apnea affects breathing during sleep. It causes breathing to stop for a short time or to become shallow. It can also increase the risk of:  Heart attack.  Stroke.  Being very overweight (obese).  Diabetes.  Heart failure.  Irregular heartbeat. The goal of treatment is to help you breathe normally again. What are the causes? There are three kinds of sleep apnea:  Obstructive sleep apnea. This is caused by a blocked or collapsed  airway.  Central sleep apnea. This happens when the brain does not send the right signals to the muscles that control breathing.  Mixed sleep apnea. This is a combination of obstructive and central sleep apnea. The most common cause of this condition is a collapsed or blocked airway. This can happen if:  Your throat muscles are too relaxed.  Your tongue and tonsils are too large.  You are overweight.  Your airway is too small. What increases the risk?  Being overweight.  Smoking.  Having a small airway.  Being older.  Being female.  Drinking alcohol.  Taking medicines to calm yourself (sedatives or tranquilizers).  Having family members with the condition. What are the signs or symptoms?  Trouble staying asleep.  Being sleepy or tired during the day.  Getting angry a lot.  Loud snoring.  Headaches in the morning.  Not being able to focus your mind (concentrate).  Forgetting things.  Less interest in sex.  Mood swings.  Personality changes.  Feelings of sadness (depression).  Waking up a lot during the night to pee (urinate).  Dry mouth.  Sore throat. How is this diagnosed?  Your medical history.  A physical exam.  A test that is done when you are sleeping (sleep study). The test is most often done in a sleep lab but may also be done at home. How is this treated?   Sleeping on your side.  Using a medicine to get rid of mucus in your nose (decongestant).  Avoiding the use of alcohol, medicines to help you relax, or certain pain medicines (narcotics).  Losing weight, if needed.  Changing your diet.  Not smoking.  Using a machine to open your airway while you sleep, such as: ? An oral appliance. This is a mouthpiece that shifts your lower jaw forward. ? A CPAP device. This device blows air through a mask when you breathe out (exhale). ? An EPAP device. This has valves that you put in each nostril. ? A BPAP device. This device blows air  through a mask when you breathe in (inhale) and breathe out.  Having surgery if other treatments do not work. It is important to get treatment for sleep apnea. Without treatment, it can lead to:  High blood pressure.  Coronary artery disease.  In men, not being able to have an erection (impotence).  Reduced thinking ability. Follow these instructions at home: Lifestyle  Make changes that your doctor recommends.  Eat a healthy diet.  Lose weight if needed.  Avoid alcohol, medicines to help you relax, and some pain medicines.  Do not use any products that contain nicotine or tobacco, such as cigarettes, e-cigarettes, and chewing tobacco. If you need help quitting, ask your doctor. General instructions  Take over-the-counter and prescription medicines only as told by your doctor.  If you were given a machine to use while you sleep, use it only as told by your doctor.  If you are having surgery, make sure to tell your doctor you have sleep apnea. You may need to bring your device with you.  Keep all follow-up visits as told by your doctor. This is important. Contact a doctor if:  The machine that you were given to use during sleep bothers you or does not seem to be working.  You do not get better.  You get worse. Get help right away if:  Your chest hurts.  You have trouble breathing in enough air.  You have an uncomfortable feeling in your back, arms, or stomach.  You have trouble talking.  One side of your body feels weak.  A part of your face is hanging down. These symptoms may be an emergency. Do not wait to see if the symptoms will go away. Get medical help right away. Call your local emergency services (911 in the [place] Do not drive yourself to the hospital. Summary  This condition affects breathing during sleep.  The most common cause is a collapsed or blocked airway.  The goal of treatment is to help you breathe normally while you sleep. This  information is not intended to replace advice given to you by your health care provider. Make sure you discuss any questions you have with your health care provider. Document Revised: 12/05/2017 Document Reviewed: 10/14/2017 Elsevier Patient Education  [place].   Influenza Virus Vaccine injection What is this medicine? INFLUENZA VIRUS VACCINE (in floo EN zuh VAHY ruhs vak SEEN) helps to reduce the risk of getting influenza also known as the flu. The vaccine only helps protect you against some strains of the flu. This medicine may be used for other purposes; ask your health care provider or pharmacist if you have questions. COMMON BRAND NAME(S): Afluria, Afluria Quadrivalent, Agriflu, Alfuria, FLUAD, Fluarix, Fluarix Quadrivalent, Flublok, Flublok Quadrivalent, FLUCELVAX, FLUCELVAX Quadrivalent, Flulaval, Flulaval Quadrivalent, Fluvirin, Fluzone, Fluzone High-Dose, Fluzone Intradermal, Fluzone Quadrivalent What should I tell my health care provider before I take this medicine? They need to know if you have any of these conditions:  bleeding disorder like hemophilia  fever or infection  Guillain-Barre syndrome or other neurological problems  immune system problems  infection with the human immunodeficiency virus (HIV) or AIDS  low blood platelet counts  multiple sclerosis  an unusual or allergic reaction to influenza virus vaccine, latex, other medicines, foods, dyes, or preservatives. Different brands of vaccines contain different allergens. Some may contain latex or eggs. Talk to your doctor about your allergies to make sure that you get the right vaccine.  pregnant or trying to get pregnant  breast-feeding How should I use this medicine? This vaccine is for injection into a muscle or under the skin. It is given by a health care professional. A copy of Vaccine Information Statements will be given before each vaccination. Read this sheet carefully each time. The sheet may  change frequently. Talk to your healthcare provider to see which vaccines are right for you. Some vaccines should not be used in all age groups. Overdosage: If you think you have taken too much of this medicine contact a poison control center or emergency room at once. NOTE: This medicine is only for you. Do not share this medicine with others. What if I miss a dose? This does not apply. What may  interact with this medicine?  chemotherapy or radiation therapy  medicines that lower your immune system like etanercept, anakinra, infliximab, and adalimumab  medicines that treat or prevent blood clots like warfarin  phenytoin  steroid medicines like prednisone or cortisone  theophylline  vaccines This list may not describe all possible interactions. Give your health care provider a list of all the medicines, herbs, non-prescription drugs, or dietary supplements you use. Also tell them if you smoke, drink alcohol, or use illegal drugs. Some items may interact with your medicine. What should I watch for while using this medicine? Report any side effects that do not go away within 3 days to your doctor or health care professional. Call your health care provider if any unusual symptoms occur within 6 weeks of receiving this vaccine. You may still catch the flu, but the illness is not usually as bad. You cannot get the flu from the vaccine. The vaccine will not protect against colds or other illnesses that may cause fever. The vaccine is needed every year. What side effects may I notice from receiving this medicine? Side effects that you should report to your doctor or health care professional as soon as possible:  allergic reactions like skin rash, itching or hives, swelling of the face, lips, or tongue Side effects that usually do not require medical attention (report to your doctor or health care professional if they continue or are bothersome):  fever  headache  muscle aches and  pains  pain, tenderness, redness, or swelling at the injection site  tiredness This list may not describe all possible side effects. Call your doctor for medical advice about side effects. You may report side effects to FDA at 1-800-FDA-1088. Where should I keep my medicine? The vaccine will be given by a health care professional in a clinic, pharmacy, doctor's office, or other health care setting. You will not be given vaccine doses to store at home. NOTE: This sheet is a summary. It may not cover all possible information. If you have questions about this medicine, talk to your doctor, pharmacist, or health care provider.  2020 Elsevier/Gold Standard (2018-01-13 08:45:43)  Health Risks of Smoking Smoking cigarettes is very bad for your health. Tobacco smoke has over 200 known poisons in it. It contains the poisonous gases nitrogen oxide and carbon monoxide. There are over 60 chemicals in tobacco smoke that cause cancer. Smoking is difficult to quit because a chemical in tobacco, called nicotine, causes addiction or dependence. When you smoke and inhale, nicotine is absorbed rapidly into the bloodstream through your lungs. Both inhaled and non-inhaled nicotine may be addictive. What are the risks of cigarette smoke? Cigarette smokers have an increased risk of many serious medical problems, including:  Lung cancer.  Lung disease, such as pneumonia, bronchitis, and emphysema.  Chest pain (angina) and heart attack because the heart is not getting enough oxygen.  Heart disease and peripheral blood vessel disease.  High blood pressure (hypertension).  Stroke.  Oral cancer, including cancer of the lip, mouth, or voice box.  Bladder cancer.  Pancreatic cancer.  Cervical cancer.  Pregnancy complications, including premature birth.  Stillbirths and smaller newborn babies, birth defects, and genetic damage to sperm.  Early menopause.  Lower estrogen level for  women.  Infertility.  Facial wrinkles.  Blindness.  Increased risk of broken bones (fractures).  Senile dementia.  Stomach ulcers and internal bleeding.  Delayed wound healing and increased risk of complications during surgery.  Even smoking lightly shortens your life  expectancy by several years. Because of secondhand smoke exposure, children of smokers have an increased risk of the following:  Sudden infant death syndrome (SIDS).  Respiratory infections.  Lung cancer.  Heart disease.  Ear infections. What are the benefits of quitting? There are many health benefits of quitting smoking. Here are some of them:  Within days of quitting smoking, your risk of having a heart attack decreases, your blood flow improves, and your lung capacity improves. Blood pressure, pulse rate, and breathing patterns start returning to normal soon after quitting.  Within months, your lungs may clear up completely.  Quitting for 10 years reduces your risk of developing lung cancer and heart disease to almost that of a nonsmoker.  People who quit may see an improvement in their overall quality of life. How do I quit smoking?     Smoking is an addiction with both physical and psychological effects, and longtime habits can be hard to change. Your health care provider can recommend:  Programs and community resources, which may include group support, education, or talk therapy.  Prescription medicines to help reduce cravings.  Nicotine replacement products, such as patches, gum, and nasal sprays. Use these products only as directed. Do not replace cigarette smoking with electronic cigarettes, which are commonly called e-cigarettes. The safety of e-cigarettes is not known, and some may contain harmful chemicals.  A combination of two or more of these methods. Where to find more information  American Lung Association: www.lung.org  American Cancer Society: www.cancer.org Summary  Smoking  cigarettes is very bad for your health. Cigarette smokers have an increased risk of many serious medical problems, including several cancers, heart disease, and stroke.  Smoking is an addiction with both physical and psychological effects, and longtime habits can be hard to change.  By stopping right away, you can greatly reduce the risk of medical problems for you and your family.  To help you quit smoking, your health care provider can recommend programs, community resources, prescription medicines, and nicotine replacement products such as patches, gum, and nasal sprays. This information is not intended to replace advice given to you by your health care provider. Make sure you discuss any questions you have with your health care provider. Document Revised: 05/22/2017 Document Reviewed: 02/23/2016 Elsevier Patient Education  2020 [employer].

## 2020-01-25 NOTE — Assessment & Plan Note (Signed)
Plan: Flu vaccine today Emphasized need to stop smoking Would recommend Pneumovax 23 Recommend patient obtains COVID-19 booster

## 2020-01-25 NOTE — Assessment & Plan Note (Addendum)
Current smoker Smoking half pack per day Working on stopping smoking 20-pack-year smoking history  Plan: Chest x-ray today Emphasized need to stop smoking Flu vaccine today Recommend Pneumovax 23 at next office visit At age 49 would recommend referral to lung cancer screening program May need to consider pulmonary function testing at some point

## 2020-01-25 NOTE — Assessment & Plan Note (Signed)
Tested positive for COVID-19 and August/2020 Did not require hospitalization Did not receive monoclonal antibody infusion  Plan: Chest x-ray today Obtain COVID-19 booster

## 2020-01-25 NOTE — Assessment & Plan Note (Signed)
Epworth score today 3 STOP-BANG score 5 BMI 41.2 Witnessed snoring as well as recorded apneas  Plan: We'll order home sleep study

## 2020-01-25 NOTE — Progress Notes (Signed)
@Patient  ID: Crystal Leonard, female    DOB: [birth year], 49 y.o.   MRN: [id number]  Chief Complaint  Patient presents with  . Sleep consult    per Mable Paris, NP--c/o loud snoring, restless sleep and daytime sleepiness x1y    Referring provider: Burnard Hawthorne, FNP  HPI:  49 year old female current some day smoker initially referred to our office on 01/25/2020 for a sleep consult  PMH: Hypertension, depression, migraines, chronic pain, prediabetes Smoker/ Smoking History: Current smoker. 0.5 ppd. 20 pack year smoker.  Maintenance: Symbicort 80 Pt of: Needs outpatient pulmonologist  01/25/2020  - Visit   50 year old female current smoker initially referred to our office on 01/25/2020 for sleep consult.  Patient presenting today reporting significant amount of snoring.  She also reports her sleep and finds this is quite restless.  She reports daytime fatigue and sleepiness.  Epworth score today is 3.  STOP BANG questionnaire  Snoring? Yes Tiredness? Yes  Observed apneas? Yes Elevated blood pressure? Yes BMI greater than 35? Yes Age greater than 59? No  Neck circumference greater than 40 cm? No  Female gender? No   Scoring:  One-point is signed for each.   0-2 equals low risk, 3-4 equals intermediate risk, those with 5 or greater high risk for having obstructive sleep apnea  Score: 5  Patient does still continue to smoke she is working on decreasing this.  Smoking around half pack per day.  She is working on trying to stop.  She does have a inhaler prescribed to her.  She was not taking them properly though.  She is taking Symbicort 80 as needed.  We will review this today.  She has not yet received her seasonal flu vaccine.  She is not received her Pneumovax 23 is an active smoker.  She is also not received her COVID-19 booster.  We will discuss this today.  Questionaires / Pulmonary Flowsheets:   ACT:  No flowsheet data found.  MMRC: No flowsheet data  found.  Epworth:  Results of the Epworth flowsheet 01/25/2020  Sitting and reading 1  Watching TV 0  Sitting, inactive in a public place (e.g. a theatre or a meeting) 0  As a passenger in a car for an hour without a break 1  Lying down to rest in the afternoon when circumstances permit 1  Sitting and talking to someone 0  Sitting quietly after a lunch without alcohol 0  In a car, while stopped for a few minutes in traffic 0  Total score 3    Tests:   10/07/2018-chest x-ray-normal exam  FENO:  No results found for: NITRICOXIDE  PFT: No flowsheet data found.  WALK:  No flowsheet data found.  Imaging: No results found.  Lab Results:  CBC    Component Value Date/Time   WBC 9.3 10/12/2019 0833   RBC 5.05 10/12/2019 0833   HGB 15.2 (H) 10/12/2019 0833   HCT 44.7 10/12/2019 0833   PLT 390.0 10/12/2019 0833   MCV 88.4 10/12/2019 0833   MCH 29.2 10/07/2018 1606   MCHC 34.1 10/12/2019 0833   RDW 14.0 10/12/2019 0833   LYMPHSABS 3.1 10/12/2019 0833   MONOABS 0.5 10/12/2019 0833   EOSABS 0.2 10/12/2019 0833   BASOSABS 0.1 10/12/2019 0833    BMET    Component Value Date/Time   NA 140 10/12/2019 0833   NA 139 05/09/2015 0537   K 4.0 10/12/2019 0833   CL 103 10/12/2019 0833   CO2 29 10/12/2019  0630   GLUCOSE 94 10/12/2019 0833   BUN 19 10/12/2019 0833   BUN 15 05/28/2014 0000   CREATININE 0.92 10/12/2019 0833   CALCIUM 10.0 10/12/2019 0833   GFRNONAA >60 10/07/2018 1606   GFRAA >60 10/07/2018 1606    BNP No results found for: BNP  ProBNP No results found for: PROBNP  Specialty Problems      Pulmonary Problems   Cough present for greater than 3 weeks   Bacterial sinusitis      Allergies  Allergen Reactions  . Tetanus Toxoids Nausea And Vomiting and Other (See Comments)    Syncope     Immunization History  Administered Date(s) Administered  . Influenza-Unspecified 12/21/2017  . PFIZER SARS-COV-2 Vaccination 05/08/2019, 06/01/2019    Past  Medical History:  Diagnosis Date  . Chicken pox   . Depression   . Headache    Frequent headaches  . Hypertension   . Kidney stones   . Migraine   . UTI (lower urinary tract infection)     Tobacco History: Social History   Tobacco Use  Smoking Status Current Some Day Smoker  . Packs/day: 1.00  . Years: 20.00  . Pack years: 20.00  . Types: Cigarettes  Smokeless Tobacco Never Used  Tobacco Comment   0.5 pack per week- 01/25/2020   Ready to quit: No Counseling given: Yes Comment: 0.5 pack per week- 01/25/2020  Smoking assessment and cessation counseling  Patient currently smoking: 10 cigarettes a day  I have advised the patient to quit/stop smoking as soon as possible due to high risk for multiple medical problems.  It will also be very difficult for [place] to manage patient's  respiratory symptoms and status if we continue to expose her lungs to a known irritant.  We do not advise e-cigarettes as a form of stopping smoking.  Patient is willing to quit smoking.  Working on decreasing her smoking.  Has not set a quit date.  I have advised the patient that we can assist and have options of nicotine replacement therapy, provided smoking cessation education today, provided smoking cessation counseling, and provided cessation resources.  Follow-up next office visit office visit for assessment of smoking cessation.    Smoking cessation counseling advised for: 4 min   Outpatient Encounter Medications as of 01/25/2020  Medication Sig  . albuterol (PROVENTIL HFA) 108 (90 Base) MCG/ACT inhaler Inhale 2 puffs into the lungs every 6 (six) hours as needed for wheezing or shortness of breath.  . citalopram (CELEXA) 40 MG tablet Take 40 mg by mouth daily.  . clonazePAM (KLONOPIN) 0.5 MG tablet Take by mouth daily as needed.  . DULoxetine (CYMBALTA) 60 MG capsule Take 60 mg by mouth daily.  . hydrochlorothiazide (HYDRODIURIL) 12.5 MG tablet TAKE 1 TABLET BY MOUTH EVERY DAY. NEEDS  FOLLOW-UP APPT  . Multiple Vitamin (MULTIVITAMIN) tablet Take 1 tablet by mouth daily.  Marland Kitchen omeprazole (PRILOSEC) 20 MG capsule Take 20 mg by mouth as needed.  . SYMBICORT 80-4.5 MCG/ACT inhaler INHALE 2 PUFFS INTO THE LUNGS 2 (TWO) TIMES DAILY. RINSE MOUTH   No facility-administered encounter medications on file as of 01/25/2020.     Review of Systems  Review of Systems  Constitutional: Negative for activity change, fatigue and fever.  HENT: Negative for sinus pressure, sinus pain and sore throat.   Respiratory: Positive for cough. Negative for shortness of breath and wheezing.   Cardiovascular: Negative for chest pain and palpitations.  Gastrointestinal: Negative for diarrhea, nausea and vomiting.  Musculoskeletal: Negative for arthralgias.  Neurological: Negative for dizziness.  Psychiatric/Behavioral: Positive for sleep disturbance. The patient is not nervous/anxious.      Physical Exam  BP 132/76 (BP Location: Left Arm, Cuff Size: Normal)   Pulse 81   Temp 97.7 F (36.5 C) (Temporal)   Ht 5\' 6"  (1.676 m)   Wt 255 lb 3.2 oz (115.8 kg)   SpO2 98%   BMI 41.19 kg/m   Wt Readings from Last 5 Encounters:  01/25/20 255 lb 3.2 oz (115.8 kg)  12/17/19 253 lb 9.6 oz (115 kg)  11/30/19 250 lb (113.4 kg)  09/15/19 250 lb (113.4 kg)  04/09/19 255 lb (115.7 kg)    BMI Readings from Last 5 Encounters:  01/25/20 41.19 kg/m  12/17/19 40.93 kg/m  11/30/19 40.35 kg/m  09/15/19 40.37 kg/m  04/09/19 41.16 kg/m     Physical Exam Vitals and nursing note reviewed.  Constitutional:      General: She is not in acute distress.    Appearance: Normal appearance. She is obese.  HENT:     Head: Normocephalic and atraumatic.     Right Ear: External ear normal.     Left Ear: External ear normal.     Nose: Nose normal. No congestion.     Mouth/Throat:     Mouth: Mucous membranes are moist.     Pharynx: Oropharynx is clear.  Eyes:     Pupils: Pupils are equal, round, and  reactive to light.  Cardiovascular:     Rate and Rhythm: Normal rate and regular rhythm.     Pulses: Normal pulses.     Heart sounds: Normal heart sounds. No murmur heard.   Pulmonary:     Effort: Pulmonary effort is normal. No respiratory distress.     Breath sounds: Normal breath sounds. No decreased air movement. No decreased breath sounds, wheezing or rales.  Musculoskeletal:     Cervical back: Normal range of motion.  Skin:    General: Skin is warm and dry.     Capillary Refill: Capillary refill takes less than 2 seconds.  Neurological:     General: No focal deficit present.     Mental Status: She is alert and oriented to person, place, and time. Mental status is at baseline.     Gait: Gait normal.  Psychiatric:        Mood and Affect: Mood normal.        Behavior: Behavior normal.        Thought Content: Thought content normal.        Judgment: Judgment normal.       Assessment & Plan:   At risk for obstructive sleep apnea Epworth score today 3 STOP-BANG score 5 BMI 41.2 Witnessed snoring as well as recorded apneas  Plan: We'll order home sleep study  Healthcare maintenance Plan: Flu vaccine today Emphasized need to stop smoking Would recommend Pneumovax 23 Recommend patient obtains COVID-19 booster  History of COVID-19 Tested positive for COVID-19 and August/2020 Did not require hospitalization Did not receive monoclonal antibody infusion  Plan: Chest x-ray today Obtain COVID-19 booster  Tobacco use disorder Current smoker Smoking half pack per day Working on stopping smoking 20-pack-year smoking history  Plan: Chest x-ray today Emphasized need to stop smoking Flu vaccine today Recommend Pneumovax 23 at next office visit At age 75 would recommend referral to lung cancer screening program May need to consider pulmonary function testing at some point    Return in about 3 months (around  04/26/2020), or if symptoms worsen or fail to improve,  for [facility], Follow up with Wyn Quaker FNP-C.   Lauraine Rinne, NP 01/25/2020   This appointment required 32 minutes of patient care (this includes precharting, chart review, review of results, face-to-face care, etc.).

## 2020-01-26 NOTE — Progress Notes (Signed)
Reviewed and agree with assessment/plan.   Rayshawn Maney, MD [facility] 01/26/2020, 7:28 PM Pager:  [phone]  

## 2020-02-01 ENCOUNTER — Other Ambulatory Visit: Payer: Self-pay

## 2020-02-01 DIAGNOSIS — Z8616 Personal history of COVID-19: Secondary | ICD-10-CM

## 2020-02-09 ENCOUNTER — Other Ambulatory Visit: Payer: Self-pay

## 2020-02-09 ENCOUNTER — Ambulatory Visit: Payer: BC Managed Care – PPO

## 2020-02-09 DIAGNOSIS — G4733 Obstructive sleep apnea (adult) (pediatric): Secondary | ICD-10-CM | POA: Diagnosis not present

## 2020-02-09 DIAGNOSIS — Z9189 Other specified personal risk factors, not elsewhere classified: Secondary | ICD-10-CM

## 2020-02-11 ENCOUNTER — Telehealth: Payer: Self-pay | Admitting: Pulmonary Disease

## 2020-02-11 DIAGNOSIS — G4733 Obstructive sleep apnea (adult) (pediatric): Secondary | ICD-10-CM | POA: Diagnosis not present

## 2020-02-11 NOTE — Telephone Encounter (Signed)
02/11/20   Please see the message below.  Patient's home sleep study has been reviewed.  Home sleep study showing mild obstructive sleep apnea.  Patient stop breathing about 13 times an hour during her sleep study.  The patient would like to discuss these findings in more detail she can be scheduled for an office visit or televisit to further discuss.  The patient is agreeable to starting CPAP therapy okay to place order for:  New CPAP start APAP setting 5-15 Mask of choice Supplies DME of choice  Patient will need to contact her office and schedule a 21-month follow-up once she receives her CPAP as we will need to see at least 30 days compliance within the first 90 days of her start.  Wyn Quaker, FNP

## 2020-02-11 NOTE — Telephone Encounter (Signed)
-----   Message from Chesley Mires, MD sent at 02/11/2020  1:11 PM EST ----- Home sleep study from 02/09/20 showed mild obstructive sleep apnea with an AHI of 13.1 and SpO2 low of 85%.  Thanks.  Crystal Leonard

## 2020-02-11 NOTE — Telephone Encounter (Signed)
Lm for patient.  

## 2020-02-15 NOTE — Telephone Encounter (Signed)
Patient is aware of results. Voiced understanding.  She would like to schedule phone visit to discuss sleep study prior to starting cpap.  Phone visit has been scheduled for 02/24/2020 at 9:30. Nothing further needed.

## 2020-02-24 ENCOUNTER — Ambulatory Visit (INDEPENDENT_AMBULATORY_CARE_PROVIDER_SITE_OTHER): Payer: BC Managed Care – PPO | Admitting: Pulmonary Disease

## 2020-02-24 ENCOUNTER — Encounter: Payer: Self-pay | Admitting: Pulmonary Disease

## 2020-02-24 ENCOUNTER — Other Ambulatory Visit: Payer: Self-pay

## 2020-02-24 DIAGNOSIS — F17200 Nicotine dependence, unspecified, uncomplicated: Secondary | ICD-10-CM

## 2020-02-24 DIAGNOSIS — Z8616 Personal history of COVID-19: Secondary | ICD-10-CM

## 2020-02-24 DIAGNOSIS — G4733 Obstructive sleep apnea (adult) (pediatric): Secondary | ICD-10-CM | POA: Diagnosis not present

## 2020-02-24 NOTE — Assessment & Plan Note (Signed)
Tested positive for COVID-19 and August/2020 Did not require hospitalization Did not receive monoclonal antibody infusion  Plan: Obtain COVID-19 booster

## 2020-02-24 NOTE — Progress Notes (Signed)
Virtual Visit via Telephone Note  I connected with Crystal Leonard on 02/24/20 at  9:30 AM EST by telephone and verified that I am speaking with the correct person using two identifiers.  Location: Patient: Home Provider: [facility] - [id number], [place], [place], [place] [phone]   I discussed the limitations, risks, security and privacy concerns of performing an evaluation and management service by telephone and the availability of in person appointments. I also discussed with the patient that there may be a patient responsible charge related to this service. The patient expressed understanding and agreed to proceed.  Patient consented to consult via telephone: Yes People present and their role in pt care: Pt     History of Present Illness:  49 year old female current some day smoker initially referred to our office on 01/25/2020 for a sleep consult  PMH: Hypertension, depression, migraines, chronic pain, prediabetes Smoker/ Smoking History: Current smoker. 0.5 ppd. 20 pack year smoker.  Maintenance: Symbicort 80 Pt : [facility]   Chief complaint: Discuss sleep study results  49 year old female current smoker followed in our office for mild obstructive sleep apnea.  Patient completed a home sleep study on 02/09/2020.  We will review these results today.  Home sleep study showed mild obstructive sleep apnea.  Patient with mild daytime sleepiness.  November/2021 Epworth score was 3.  Patient reports that she is recently started working with a nutritionist and is working on reducing her BMI.  Observations/Objective:  10/07/2018-chest x-ray-normal exam  02/09/20 - HST - AHI 13.1  Results of the Epworth flowsheet 01/25/2020  Sitting and reading 1  Watching TV 0  Sitting, inactive in a public place (e.g. a theatre or a meeting) 0  As a passenger in a car for an hour without a break 1  Lying down to rest in the afternoon when circumstances permit 1   Sitting and talking to someone 0  Sitting quietly after a lunch without alcohol 0  In a car, while stopped for a few minutes in traffic 0  Total score 3    Social History   Tobacco Use  Smoking Status Current Some Day Smoker  . Packs/day: 1.00  . Years: 20.00  . Pack years: 20.00  . Types: Cigarettes  Smokeless Tobacco Never Used  Tobacco Comment   0.5 pack per week- 01/25/2020   Immunization History  Administered Date(s) Administered  . Influenza,inj,Quad PF,6+ Mos 01/25/2020  . Influenza-Unspecified 12/21/2017  . PFIZER SARS-COV-2 Vaccination 05/08/2019, 06/01/2019      Assessment and Plan:  OSA (obstructive sleep apnea) Discussed pathophysiology of obstructive sleep apnea Reviewed mild obstructive sleep apnea Reviewed treatment options such as reducing BMI, oral appliance or CPAP therapy  Plan: Patient would like to proceed forward with working to reduce her BMI Patient will defer starting CPAP therapy or oral appliance at this point in time Patient is starting to work with a nutritionist to work to reduce her BMI she will discuss this with primary care Patient is aware that she can contact our office if she starts having worsening daytime fatigue or sleepiness so we could consider starting treatment  History of COVID-19 Tested positive for COVID-19 and August/2020 Did not require hospitalization Did not receive monoclonal antibody infusion  Plan: Obtain COVID-19 booster  Tobacco use disorder Current smoker Smoking half pack per day Working on stopping smoking 20-pack-year smoking history  Plan: Emphasized need to stop smoking Recommend Pneumovax 23 at next office visit At age 24 would  recommend referral to lung cancer screening program May need to consider pulmonary function testing at some point   Follow Up Instructions:  Return in about 1 year (around 02/23/2021), or if symptoms worsen or fail to improve, for [facility].   I  discussed the assessment and treatment plan with the patient. The patient was provided an opportunity to ask questions and all were answered. The patient agreed with the plan and demonstrated an understanding of the instructions.   The patient was advised to call back or seek an in-person evaluation if the symptoms worsen or if the condition fails to improve as anticipated.  I provided 23 minutes of non-face-to-face time during this encounter.   Lauraine Rinne, NP

## 2020-02-24 NOTE — Progress Notes (Signed)
Reviewed and agree with assessment/plan.   Rylee Nuzum, MD [facility] 02/24/2020, 7:08 PM Pager:  [phone]  

## 2020-02-24 NOTE — Patient Instructions (Addendum)
You were seen today by Lauraine Rinne, NP  for:   1. OSA (obstructive sleep apnea)  Work on reducing your BMI  If you become interested in getting referred to a dentist for an oral appliance or starting CPAP therapy please contact our office  2. History of COVID-19  Will continue to clinically monitor   3. Tobacco use disorder  We recommend that you stop smoking.  >>>You need to set a quit date >>>If you have friends or family who smoke, let them know you are trying to quit and not to smoke around you or in your living environment  Smoking Cessation Resources:  1 800 QUIT NOW  >>> Patient to call this resource and utilize it to help support her quit smoking >>> Keep up your hard work with stopping smoking  You can also contact the [facility] >>>For smoking cessation classes call [phone]  We do not recommend using e-cigarettes as a form of stopping smoking  You can sign up for smoking cessation support texts and information:  >>>https://smokefree.gov/smokefreetxt   Follow Up:    Return in about 1 year (around 02/23/2021), or if symptoms worsen or fail to improve, for [facility].   Notification of test results are managed in the following manner: If there are  any recommendations or changes to the  plan of care discussed in office today,  we will contact you and let you know what they are. If you do not hear from [place], then your results are normal and you can view them through your  MyChart account , or a letter will be sent to you. Thank you again for trusting [place] with your care  - Thank you, [facility]    It is flu season:   >>> Best ways to protect herself from the flu: Receive the yearly flu vaccine, practice good hand hygiene washing with soap and also using hand sanitizer when available, eat a nutritious meals, get adequate rest, hydrate appropriately       Please contact the office if your symptoms worsen or you have  concerns that you are not improving.   Thank you for choosing [facility] for your healthcare, and for allowing [place] to partner with you on your healthcare journey. I am thankful to be able to provide care to you today.   Wyn Quaker FNP-C

## 2020-02-24 NOTE — Assessment & Plan Note (Signed)
Discussed pathophysiology of obstructive sleep apnea Reviewed mild obstructive sleep apnea Reviewed treatment options such as reducing BMI, oral appliance or CPAP therapy  Plan: Patient would like to proceed forward with working to reduce her BMI Patient will defer starting CPAP therapy or oral appliance at this point in time Patient is starting to work with a nutritionist to work to reduce her BMI she will discuss this with primary care Patient is aware that she can contact our office if she starts having worsening daytime fatigue or sleepiness so we could consider starting treatment

## 2020-02-24 NOTE — Assessment & Plan Note (Signed)
Current smoker Smoking half pack per day Working on stopping smoking 20-pack-year smoking history  Plan: Emphasized need to stop smoking Recommend Pneumovax 23 at next office visit At age 49 would recommend referral to lung cancer screening program May need to consider pulmonary function testing at some point

## 2020-03-07 ENCOUNTER — Encounter: Payer: BC Managed Care – PPO | Admitting: Family

## 2020-03-31 ENCOUNTER — Encounter: Payer: Self-pay | Admitting: *Deleted

## 2020-03-31 ENCOUNTER — Emergency Department
Admission: EM | Admit: 2020-03-31 | Discharge: 2020-03-31 | Disposition: A | Discharge status: Home / Self Care | Payer: BC Managed Care – PPO | Attending: Emergency Medicine | Admitting: Emergency Medicine

## 2020-03-31 ENCOUNTER — Other Ambulatory Visit: Payer: Self-pay

## 2020-03-31 ENCOUNTER — Emergency Department: Payer: BC Managed Care – PPO

## 2020-03-31 DIAGNOSIS — Z85828 Personal history of other malignant neoplasm of skin: Secondary | ICD-10-CM | POA: Diagnosis not present

## 2020-03-31 DIAGNOSIS — F17210 Nicotine dependence, cigarettes, uncomplicated: Secondary | ICD-10-CM | POA: Diagnosis not present

## 2020-03-31 DIAGNOSIS — R110 Nausea: Secondary | ICD-10-CM | POA: Diagnosis not present

## 2020-03-31 DIAGNOSIS — Z8616 Personal history of COVID-19: Secondary | ICD-10-CM | POA: Insufficient documentation

## 2020-03-31 DIAGNOSIS — I10 Essential (primary) hypertension: Secondary | ICD-10-CM | POA: Insufficient documentation

## 2020-03-31 DIAGNOSIS — R220 Localized swelling, mass and lump, head: Secondary | ICD-10-CM

## 2020-03-31 DIAGNOSIS — K0889 Other specified disorders of teeth and supporting structures: Secondary | ICD-10-CM | POA: Insufficient documentation

## 2020-03-31 DIAGNOSIS — Z79899 Other long term (current) drug therapy: Secondary | ICD-10-CM | POA: Diagnosis not present

## 2020-03-31 LAB — CBC
HCT: 45.7 % (ref 36.0–46.0)
Hemoglobin: 15.5 g/dL — ABNORMAL HIGH (ref 12.0–15.0)
MCH: 29.7 pg (ref 26.0–34.0)
MCHC: 33.9 g/dL (ref 30.0–36.0)
MCV: 87.5 fL (ref 80.0–100.0)
Platelets: 446 10*3/uL — ABNORMAL HIGH (ref 150–400)
RBC: 5.22 MIL/uL — ABNORMAL HIGH (ref 3.87–5.11)
RDW: 13.5 % (ref 11.5–15.5)
WBC: 17.5 10*3/uL — ABNORMAL HIGH (ref 4.0–10.5)
nRBC: 0 % (ref 0.0–0.2)

## 2020-03-31 LAB — BASIC METABOLIC PANEL
Anion gap: 12 (ref 5–15)
BUN: 15 mg/dL (ref 6–20)
CO2: 26 mmol/L (ref 22–32)
Calcium: 9.1 mg/dL (ref 8.9–10.3)
Chloride: 99 mmol/L (ref 98–111)
Creatinine, Ser: 0.77 mg/dL (ref 0.44–1.00)
GFR, Estimated: >60 mL/min (ref >60)
Glucose, Bld: 120 mg/dL — ABNORMAL HIGH (ref 70–99)
Potassium: 3.5 mmol/L (ref 3.5–5.1)
Sodium: 137 mmol/L (ref 135–145)

## 2020-03-31 LAB — LACTIC ACID, PLASMA: Lactic Acid, Venous: 0.9 mmol/L (ref 0.5–1.9)

## 2020-03-31 MED ORDER — ONDANSETRON HCL 4 MG/2ML IJ SOLN
4.0000 mg | Freq: Once | INTRAMUSCULAR | Status: AC
  Administered 2020-03-31: 4 mg via INTRAVENOUS
  Filled 2020-03-31: qty 2

## 2020-03-31 MED ORDER — OXYCODONE-ACETAMINOPHEN 5-325 MG PO TABS
1.0000 | ORAL_TABLET | ORAL | 0 refills | Status: DC | PRN
Start: 2020-03-31 — End: 2020-12-05

## 2020-03-31 MED ORDER — IOHEXOL 300 MG/ML  SOLN
75.0000 mL | Freq: Once | INTRAMUSCULAR | Status: AC | PRN
  Administered 2020-03-31: 75 mL via INTRAVENOUS

## 2020-03-31 MED ORDER — MORPHINE SULFATE (PF) 4 MG/ML IV SOLN
4.0000 mg | Freq: Once | INTRAVENOUS | Status: AC
  Administered 2020-03-31: 4 mg via INTRAVENOUS
  Filled 2020-03-31: qty 1

## 2020-03-31 MED ORDER — SODIUM CHLORIDE 0.9 % IV BOLUS
1000.0000 mL | Freq: Once | INTRAVENOUS | Status: AC
  Administered 2020-03-31: 1000 mL via INTRAVENOUS

## 2020-03-31 MED ORDER — OXYCODONE-ACETAMINOPHEN 5-325 MG PO TABS
1.0000 | ORAL_TABLET | Freq: Once | ORAL | Status: AC
  Administered 2020-03-31: 1 via ORAL
  Filled 2020-03-31: qty 1

## 2020-03-31 NOTE — ED Provider Notes (Signed)
[facility] Emergency Department Provider Note   ____________________________________________   Event Date/Time   First MD Initiated Contact with Patient 03/31/20 0147     (approximate)  I have reviewed the triage vital signs and the nursing notes.   HISTORY  Chief Complaint Dental Pain    HPI Crystal Leonard is a 50 y.o. female who presents to the ED from home with a chief complaint of dental pain.  Patient had root canal to her left molar 2 years ago.  Began to have pain this week and saw her dentist recently who diagnosed infection beneath the root canal.  She had a dental procedure 4 days ago for this and was placed on clindamycin.  Has been having several days worth of increased pain so her dentist phoned in a prescription for Norco which has not helped her pain.  Patient also endorses nausea and swelling to the left side of her face and neck.  Denies fever, chills, cough, chest pain, shortness of breath, abdominal pain, vomiting or diarrhea.  Patient is vaccinated against COVID-19.     Past Medical History:  Diagnosis Date  . Chicken pox   . Depression   . Headache    Frequent headaches  . Hypertension   . Kidney stones   . Migraine   . UTI (lower urinary tract infection)     Patient Active Problem List   Diagnosis Date Noted  . OSA (obstructive sleep apnea) 01/25/2020  . Healthcare maintenance 01/25/2020  . History of COVID-19 01/25/2020  . Bacterial sinusitis 09/15/2019  . Skin cancer 05/09/2017  . Prediabetes 05/09/2017  . Hyperlipidemia 05/09/2017  . 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 Kidney stone 05/09/2017  . Cervicalgia 05/09/2017  . Chronic pain of left knee 09/01/2016  . Cough present for greater than 3 weeks 08/02/2015  . Paresthesia of both hands 07/06/2014  . HTN (hypertension) 05/07/2014  . Depression 05/07/2014  . Migraines 05/07/2014  . Tobacco use disorder 05/07/2014    Past  Surgical History:  Procedure Laterality Date  . ABDOMINAL HYSTERECTOMY  2012  . LAPAROSCOPIC ENDOMETRIOSIS FULGURATION    . TONSILLECTOMY AND ADENOIDECTOMY      Prior to Admission medications   Medication Sig Start Date End Date Taking? Authorizing Provider  oxyCODONE-acetaminophen (PERCOCET/ROXICET) 5-325 MG tablet Take 1 tablet by mouth every 4 (four) hours as needed for severe pain. 03/31/20  Yes Paulette Blanch, MD  albuterol (PROVENTIL HFA) 108 (90 Base) MCG/ACT inhaler Inhale 2 puffs into the lungs every 6 (six) hours as needed for wheezing or shortness of breath. 04/09/19   McLean-Scocuzza, Nino Glow, MD  citalopram (CELEXA) 40 MG tablet Take 40 mg by mouth daily. 11/09/19   [provider]  clonazePAM (KLONOPIN) 0.5 MG tablet Take by mouth daily as needed. 07/20/19   [provider]  DULoxetine (CYMBALTA) 60 MG capsule Take 60 mg by mouth daily.    [provider]  hydrochlorothiazide (HYDRODIURIL) 12.5 MG tablet TAKE 1 TABLET BY MOUTH EVERY DAY. NEEDS FOLLOW-UP APPT 11/02/19   Burnard Hawthorne, FNP  Multiple Vitamin (MULTIVITAMIN) tablet Take 1 tablet by mouth daily.    [provider]  omeprazole (PRILOSEC) 20 MG capsule Take 20 mg by mouth as needed.    [provider]  SYMBICORT 80-4.5 MCG/ACT inhaler INHALE 2 PUFFS INTO THE LUNGS 2 (TWO) TIMES DAILY. RINSE MOUTH 06/28/19   Burnard Hawthorne, FNP    Allergies Tetanus toxoids  Family History  Problem Relation Age of Onset  . Hypertension Mother   . Cancer Father        prostate  . Hypertension Maternal Grandmother   . Diabetes Brother   . Diabetes Maternal Aunt     Social History Social History   Tobacco Use  . Smoking status: Current Some Day Smoker    Packs/day: 1.00    Years: 20.00    Pack years: 20.00    Types: Cigarettes  . Smokeless tobacco: Never Used  . Tobacco comment: 0.5 pack per week- 01/25/2020  Vaping Use  . Vaping Use: Never used  Substance Use Topics  .  Alcohol use: Yes    Alcohol/week: 0.0 standard drinks    Comment: Rare occasions   . Drug use: No    Review of Systems  Constitutional: No fever/chills Eyes: No visual changes. ENT: Positive for dental pain, face and neck swelling.  No sore throat. Cardiovascular: Denies chest pain. Respiratory: Denies shortness of breath. Gastrointestinal: No abdominal pain.  No nausea, no vomiting.  No diarrhea.  No constipation. Genitourinary: Negative for dysuria. Musculoskeletal: Negative for back pain. Skin: Negative for rash. Neurological: Negative for headaches, focal weakness or numbness.   ____________________________________________   PHYSICAL EXAM:  VITAL SIGNS: ED Triage Vitals  Enc Vitals Group     BP 03/31/20 0038 (!) 170/105     Pulse Rate 03/31/20 0038 93     Resp 03/31/20 0038 20     Temp 03/31/20 0038 98.2 F (36.8 C)     Temp Source 03/31/20 0038 Oral     SpO2 03/31/20 0038 98 %     Weight 03/31/20 0039 250 lb (113.4 kg)     Height 03/31/20 0039 5\' 6"  (1.676 m)     Head Circumference --      Peak Flow --      Pain Score 03/31/20 0039 5     Pain Loc --      Pain Edu? --      Excl. in [place]? --     Constitutional: Alert and oriented. Well appearing and in mild acute distress. Eyes: Conjunctivae are normal. PERRL. EOMI. Head: Atraumatic. Nose: No congestion/rhinnorhea. Mouth/Throat: Mucous membranes are moist.  Site of root canal clean/dry/intact without surrounding abscess.  Mild left jaw and neck swelling. Neck: No stridor.   Hematological: Shotty anterior lymphadenopathy. Cardiovascular: Normal rate, regular rhythm. Grossly normal heart sounds.  Good peripheral circulation. Respiratory: Normal respiratory effort.  No retractions. Lungs CTAB. Gastrointestinal: Soft and nontender. No distention. No abdominal bruits. No CVA tenderness. Musculoskeletal: No lower extremity tenderness nor edema.  No joint effusions. Neurologic:  Normal speech and language. No gross  focal neurologic deficits are appreciated. No gait instability. Skin:  Skin is warm, dry and intact. No rash noted. Psychiatric: Mood and affect are normal. Speech and behavior are normal.  ____________________________________________   LABS (all labs ordered are listed, but only abnormal results are displayed)  Labs Reviewed  BASIC METABOLIC PANEL - Abnormal; Notable for the following components:      Result Value   Glucose, Bld 120 (*)    All other components within normal limits  CBC - Abnormal; Notable for the following components:   WBC 17.5 (*)    RBC 5.22 (*)    Hemoglobin 15.5 (*)    Platelets 446 (*)    All other components within normal limits  CULTURE, BLOOD (ROUTINE X 2)  CULTURE, BLOOD (ROUTINE X 2)  LACTIC ACID, PLASMA  ____________________________________________  EKG  None ____________________________________________  RADIOLOGY Cecilie Kicks J, personally viewed and evaluated these images (plain radiographs) as part of my medical decision making, as well as reviewing the written report by the radiologist.  ED MD interpretation: Mild left facial swelling  Official radiology report(s): CT Maxillofacial W Contrast  Result Date: 03/31/2020 CLINICAL DATA:  Recent dental procedure. Left-sided facial swelling. EXAM: CT MAXILLOFACIAL WITH CONTRAST TECHNIQUE: Multidetector CT imaging of the maxillofacial structures was performed with intravenous contrast. Multiplanar CT image reconstructions were also generated. CONTRAST:  55mL OMNIPAQUE IOHEXOL 300 MG/ML  SOLN COMPARISON:  None. FINDINGS: Osseous: No fracture or mandibular dislocation. No destructive process. Orbits: Negative. No traumatic or inflammatory finding. Sinuses: Clear. Soft tissues: Mild left facial swelling.  No fluid collection. Limited intracranial: Normal IMPRESSION: Mild left facial swelling without fluid collection. Electronically Signed   By: Ulyses Jarred M.D.   On: 03/31/2020 03:37     ____________________________________________   PROCEDURES  Procedure(s) performed (including Critical Care):  Procedures   ____________________________________________   INITIAL IMPRESSION / ASSESSMENT AND PLAN / ED COURSE  As part of my medical decision making, I reviewed the following data within the [place] History obtained from family, Nursing notes reviewed and incorporated, Labs reviewed, Old chart reviewed, Radiograph reviewed, Notes from prior ED visits and [place] Controlled Substance Database     50 year old female presenting with left facial pain and swelling status post dental procedure.  Differential diagnosis includes but is not limited to facial cellulitis, abscess, deep-seated dental infection, etc.  Laboratory results revealed leukocytosis.  Will obtain blood cultures, lactic acid.  Will proceed with CT maxillofacial to evaluate for abscess.  Administer IV hydration, IV morphine for pain paired with IV Zofran for nausea.  Will reassess.  Clinical Course as of 03/31/20 0531  Fri Mar 31, 2020  0350 Patient feeling significantly better. Strict return precautions given. Patient and spouse verbalize understanding and agree with plan of care. [JS]    Clinical Course User Index [JS] Paulette Blanch, MD     ____________________________________________   FINAL CLINICAL IMPRESSION(S) / ED DIAGNOSES  Final diagnoses:  Pain, dental  Left facial swelling     ED Discharge Orders         Ordered    oxyCODONE-acetaminophen (PERCOCET/ROXICET) 5-325 MG tablet  Every 4 hours PRN        03/31/20 0350          *Please note:  KIMBELY POHLE was evaluated in Emergency Department on 03/31/2020 for the symptoms described in the history of present illness. She was evaluated in the context of the global COVID-19 pandemic, which necessitated consideration that the patient might be at risk for infection with the SARS-CoV-2 virus that causes COVID-19.  Institutional protocols and algorithms that pertain to the evaluation of patients at risk for COVID-19 are in a state of rapid change based on information released by regulatory bodies including the CDC and federal and state organizations. These policies and algorithms were followed during the patient's care in the ED.  Some ED evaluations and interventions may be delayed as a result of limited staffing during and the pandemic.*   Note:  This document was prepared using Dragon voice recognition software and may include unintentional dictation errors.   Paulette Blanch, MD 03/31/20 [phone]

## 2020-03-31 NOTE — ED Triage Notes (Signed)
Pt has pain in left lower tooth.  Pt had a root canal infection   Pt has swelling to left side of face and neck.  Pt taking abx and pain meds without relief.  Pt  Alert, speech clear.   No resp distress.

## 2020-03-31 NOTE — Discharge Instructions (Signed)
Continue to finish antibiotic as started by your dentist. You may take Ibuprofen and Percocet as needed for pain.  You may apply ice to affected area several times daily to reduce swelling.  Return to the ER for worsening symptoms, persistent vomiting, difficulty breathing or other concerns.

## 2020-04-05 LAB — CULTURE, BLOOD (ROUTINE X 2)
Culture: NO GROWTH
Culture: NO GROWTH
Special Requests: ADEQUATE
Special Requests: ADEQUATE

## 2020-04-26 ENCOUNTER — Other Ambulatory Visit: Payer: Self-pay | Admitting: Family

## 2020-04-26 DIAGNOSIS — I10 Essential (primary) hypertension: Secondary | ICD-10-CM

## 2020-09-11 ENCOUNTER — Other Ambulatory Visit: Payer: Self-pay

## 2020-09-11 ENCOUNTER — Ambulatory Visit
Admission: RE | Admit: 2020-09-11 | Discharge: 2020-09-11 | Disposition: A | Discharge status: Home / Self Care | Payer: BC Managed Care – PPO | Source: Ambulatory Visit | Attending: Emergency Medicine | Admitting: Emergency Medicine

## 2020-09-11 VITALS — BP 139/84 | HR 87 | Temp 98.6°F | Resp 18

## 2020-09-11 DIAGNOSIS — J0100 Acute maxillary sinusitis, unspecified: Secondary | ICD-10-CM

## 2020-09-11 DIAGNOSIS — H6691 Otitis media, unspecified, right ear: Secondary | ICD-10-CM

## 2020-09-11 DIAGNOSIS — J209 Acute bronchitis, unspecified: Secondary | ICD-10-CM

## 2020-09-11 MED ORDER — AMOXICILLIN-POT CLAVULANATE 875-125 MG PO TABS: 1.0000 | ORAL_TABLET | Freq: Two times a day (BID) | ORAL | 0 refills | Status: AC

## 2020-09-11 NOTE — Discharge Instructions (Addendum)
Take the Augmentin as directed.  Follow up with your primary care provider if your symptoms are not improving.    

## 2020-09-11 NOTE — ED Triage Notes (Signed)
Patient c/o fever, nonproductive cough, and SOB x 2 weeks.   Patient endorses a temperature of 101.2 F at it's highest at home.   Patient endorses taking at home COVID-19 test with no relief of symptoms.   Patient endorses " my ears feels so filled up".  Patient endorses fatigue.  Patient endorses loss of taste and smell.   Patient endorses SOB stating "it hurts to breath".  Patient has used Mucinex and Prednisone with no relief of symptoms.

## 2020-09-11 NOTE — ED Provider Notes (Signed)
Roderic Palau    CSN: [id number] Arrival date & time: 09/11/20  0626      History   Chief Complaint Chief Complaint  Patient presents with   Fever   Cough   Shortness of Breath   APPT 0815    HPI Crystal Leonard is a 50 y.o. female.  Patient presents with 2-week history of fever, fatigue, ear pain, sinus congestion, sinus pressure, cough, shortness of breath.  She also reports loss of taste and smell.  T-max 101.2.  Treatment attempted at home with prednisone and Mucinex.  She denies rash, vomiting, diarrhea, or other symptoms.  Patient reports a negative at-home COVID test on day 2 of her symptoms.  Her medical history includes hypertension, current smoker, migraine headaches, chronic knee pain, chronic back pain, depression.  The history is provided by the patient and medical records.   Past Medical History:  Diagnosis Date   Chicken pox    Depression    Headache    Frequent headaches   Hypertension    Kidney stones    Migraine    UTI (lower urinary tract infection)     Patient Active Problem List   Diagnosis Date Noted   OSA (obstructive sleep apnea) 01/25/2020   Healthcare maintenance 01/25/2020   History of COVID-19 01/25/2020   Bacterial sinusitis 09/15/2019   Skin cancer 05/09/2017   Prediabetes 05/09/2017   Hyperlipidemia 05/09/2017   Chronic right-sided low back pain with right-sided sciatica 05/09/2017   Kidney stone 05/09/2017   Cervicalgia 05/09/2017   Chronic pain of left knee 09/01/2016   Cough present for greater than 3 weeks 08/02/2015   Paresthesia of both hands 07/06/2014   HTN (hypertension) 05/07/2014   Depression 05/07/2014   Migraines 05/07/2014   Tobacco use disorder 05/07/2014    Past Surgical History:  Procedure Laterality Date   ABDOMINAL HYSTERECTOMY  2012   LAPAROSCOPIC ENDOMETRIOSIS FULGURATION     TONSILLECTOMY AND ADENOIDECTOMY      OB History   No obstetric history on file.      Home Medications    Prior  to Admission medications   Medication Sig Start Date End Date Taking? Authorizing Provider  albuterol (PROVENTIL HFA) 108 (90 Base) MCG/ACT inhaler Inhale 2 puffs into the lungs every 6 (six) hours as needed for wheezing or shortness of breath. 04/09/19  Yes McLean-Scocuzza, Nino Glow, MD  amoxicillin-clavulanate (AUGMENTIN) 875-125 MG tablet Take 1 tablet by mouth every 12 (twelve) hours for 10 days. 09/11/20 09/21/20 Yes [name], NP  citalopram (CELEXA) 40 MG tablet Take 40 mg by mouth daily. 11/09/19  Yes [provider]  DULoxetine (CYMBALTA) 60 MG capsule Take 60 mg by mouth daily.   Yes [provider]  hydrochlorothiazide (HYDRODIURIL) 12.5 MG tablet TAKE 1 TABLET BY MOUTH EVERY DAY. NEEDS FOLLOW-UP APPT 04/26/20  Yes Burnard Hawthorne, FNP  Multiple Vitamin (MULTIVITAMIN) tablet Take 1 tablet by mouth daily.   Yes [provider]  SYMBICORT 80-4.5 MCG/ACT inhaler INHALE 2 PUFFS INTO THE LUNGS 2 (TWO) TIMES DAILY. RINSE MOUTH 06/28/19  Yes Arnett, Yvetta Coder, FNP  clonazePAM (KLONOPIN) 0.5 MG tablet Take by mouth daily as needed. 07/20/19   [provider]  omeprazole (PRILOSEC) 20 MG capsule Take 20 mg by mouth as needed.    [provider]  oxyCODONE-acetaminophen (PERCOCET/ROXICET) 5-325 MG tablet Take 1 tablet by mouth every 4 (four) hours as needed for severe pain. 03/31/20   Paulette Blanch, MD  Family History Family History  Problem Relation Age of Onset   Hypertension Mother    Cancer Father        prostate   Hypertension Maternal Grandmother    Diabetes Brother    Diabetes Maternal Aunt     Social History Social History   Tobacco Use   Smoking status: Some Days    Packs/day: 1.00    Years: 20.00    Pack years: 20.00    Types: Cigarettes   Smokeless tobacco: Never   Tobacco comments:    0.5 pack per week- 01/25/2020  Vaping Use   Vaping Use: Never used  Substance Use Topics   Alcohol use: Yes    Alcohol/week: 0.0 standard  drinks    Comment: Rare occasions    Drug use: No     Allergies   Tetanus toxoids   Review of Systems Review of Systems  Constitutional:  Positive for fatigue and fever. Negative for chills.  HENT:  Positive for congestion, ear pain and sinus pressure. Negative for sore throat.   Respiratory:  Positive for cough and shortness of breath.   Cardiovascular:  Negative for chest pain and palpitations.  Gastrointestinal:  Negative for abdominal pain, diarrhea and vomiting.  Skin:  Negative for color change and rash.  All other systems reviewed and are negative.   Physical Exam Triage Vital Signs ED Triage Vitals [09/11/20 0839]  Enc Vitals Group     BP      Pulse      Resp      Temp      Temp src      SpO2      Weight      Height      Head Circumference      Peak Flow      Pain Score 3     Pain Loc      Pain Edu?      Excl. in [place]?    No data found.  Updated Vital Signs BP 139/84 (BP Location: Left Arm)   Pulse 87   Temp 98.6 F (37 C) (Oral)   Resp 18   SpO2 97%   Visual Acuity Right Eye Distance:   Left Eye Distance:   Bilateral Distance:    Right Eye Near:   Left Eye Near:    Bilateral Near:     Physical Exam Vitals and nursing note reviewed.  Constitutional:      General: She is not in acute distress.    Appearance: She is well-developed.  HENT:     Head: Normocephalic and atraumatic.     Right Ear: Tympanic membrane is erythematous.     Left Ear: Tympanic membrane normal.     Nose: Congestion present.     Mouth/Throat:     Mouth: Mucous membranes are moist.     Pharynx: Oropharynx is clear.  Eyes:     Conjunctiva/sclera: Conjunctivae normal.  Cardiovascular:     Rate and Rhythm: Normal rate and regular rhythm.     Heart sounds: Normal heart sounds.  Pulmonary:     Effort: Pulmonary effort is normal. No respiratory distress.     Breath sounds: Normal breath sounds.  Abdominal:     Palpations: Abdomen is soft.     Tenderness: There is no  abdominal tenderness.  Musculoskeletal:     Cervical back: Neck supple.  Skin:    General: Skin is warm and dry.  Neurological:     General: No focal deficit  present.     Mental Status: She is alert and oriented to person, place, and time.     Gait: Gait normal.  Psychiatric:        Mood and Affect: Mood normal.        Behavior: Behavior normal.     UC Treatments / Results  Labs (all labs ordered are listed, but only abnormal results are displayed) Labs Reviewed  NOVEL CORONAVIRUS, NAA    EKG   Radiology No results found.  Procedures Procedures (including critical care time)  Medications Ordered in UC Medications - No data to display  Initial Impression / Assessment and Plan / UC Course  I have reviewed the triage vital signs and the nursing notes.  Pertinent labs & imaging results that were available during my care of the patient were reviewed by me and considered in my medical decision making (see chart for details).  Right otitis media, acute sinusitis, acute bronchitis.  Treating with Augmentin.  COVID pending.  Instructed patient to self quarantine per CDC guidelines.  Discussed symptomatic treatment including Tylenol or ibuprofen, rest, hydration.  Instructed patient to follow up with PCP if symptoms are not improving.  Patient agrees to plan of care.     Final Clinical Impressions(s) / UC Diagnoses   Final diagnoses:  Right otitis media, unspecified otitis media type  Acute non-recurrent maxillary sinusitis  Acute bronchitis, unspecified organism     Discharge Instructions      Take the Augmentin as directed.    Follow up with your primary care provider if your symptoms are not improving.         ED Prescriptions     Medication Sig Dispense Auth. Provider   amoxicillin-clavulanate (AUGMENTIN) 875-125 MG tablet Take 1 tablet by mouth every 12 (twelve) hours for 10 days. 20 tablet [name], NP      PDMP not reviewed this encounter.    [name], NP 09/11/20 [phone]

## 2020-09-12 LAB — NOVEL CORONAVIRUS, NAA: SARS-CoV-2, NAA: NOT DETECTED

## 2020-09-12 LAB — SARS-COV-2, NAA 2 DAY TAT

## 2020-10-23 ENCOUNTER — Other Ambulatory Visit: Payer: Self-pay | Admitting: Family

## 2020-10-23 DIAGNOSIS — I10 Essential (primary) hypertension: Secondary | ICD-10-CM

## 2020-11-20 ENCOUNTER — Other Ambulatory Visit: Payer: Self-pay | Admitting: Family

## 2020-11-20 DIAGNOSIS — I10 Essential (primary) hypertension: Secondary | ICD-10-CM

## 2020-12-05 ENCOUNTER — Encounter: Payer: Self-pay | Admitting: Family

## 2020-12-05 ENCOUNTER — Other Ambulatory Visit: Payer: Self-pay

## 2020-12-05 ENCOUNTER — Ambulatory Visit: Payer: BC Managed Care – PPO | Admitting: Family

## 2020-12-05 VITALS — BP 130/88 | HR 81 | Ht 66.0 in | Wt 243.4 lb

## 2020-12-05 DIAGNOSIS — G4733 Obstructive sleep apnea (adult) (pediatric): Secondary | ICD-10-CM | POA: Diagnosis not present

## 2020-12-05 DIAGNOSIS — Z0000 Encounter for general adult medical examination without abnormal findings: Secondary | ICD-10-CM

## 2020-12-05 DIAGNOSIS — I10 Essential (primary) hypertension: Secondary | ICD-10-CM

## 2020-12-05 DIAGNOSIS — Z1231 Encounter for screening mammogram for malignant neoplasm of breast: Secondary | ICD-10-CM | POA: Diagnosis not present

## 2020-12-05 DIAGNOSIS — F17200 Nicotine dependence, unspecified, uncomplicated: Secondary | ICD-10-CM

## 2020-12-05 DIAGNOSIS — Z1211 Encounter for screening for malignant neoplasm of colon: Secondary | ICD-10-CM | POA: Diagnosis not present

## 2020-12-05 DIAGNOSIS — F325 Major depressive disorder, single episode, in full remission: Secondary | ICD-10-CM

## 2020-12-05 DIAGNOSIS — Z23 Encounter for immunization: Secondary | ICD-10-CM

## 2020-12-05 LAB — COMPREHENSIVE METABOLIC PANEL
ALT: 15 U/L (ref 0–35)
AST: 13 U/L (ref 0–37)
Albumin: 4.4 g/dL (ref 3.5–5.2)
Alkaline Phosphatase: 75 U/L (ref 39–117)
BUN: 18 mg/dL (ref 6–23)
CO2: 30 mEq/L (ref 19–32)
Calcium: 10.4 mg/dL (ref 8.4–10.5)
Chloride: 100 mEq/L (ref 96–112)
Creatinine, Ser: 0.86 mg/dL (ref 0.40–1.20)
GFR: 79.66 mL/min (ref >60.00)
Glucose, Bld: 87 mg/dL (ref 70–99)
Potassium: 4.2 mEq/L (ref 3.5–5.1)
Sodium: 139 mEq/L (ref 135–145)
Total Bilirubin: 0.5 mg/dL (ref 0.2–1.2)
Total Protein: 7.4 g/dL (ref 6.0–8.3)

## 2020-12-05 LAB — LIPID PANEL
Cholesterol: 223 mg/dL — ABNORMAL HIGH (ref 0–200)
HDL: 47.4 mg/dL (ref >39.00)
LDL Cholesterol: 141 mg/dL — ABNORMAL HIGH (ref 0–99)
NonHDL: 175.19
Total CHOL/HDL Ratio: 5
Triglycerides: 170 mg/dL — ABNORMAL HIGH (ref 0.0–149.0)
VLDL: 34 mg/dL (ref 0.0–40.0)

## 2020-12-05 LAB — CBC WITH DIFFERENTIAL/PLATELET
Basophils Absolute: 0.1 10*3/uL (ref 0.0–0.1)
Basophils Relative: 0.8 % (ref 0.0–3.0)
Eosinophils Absolute: 0.2 10*3/uL (ref 0.0–0.7)
Eosinophils Relative: 2.5 % (ref 0.0–5.0)
HCT: 46.5 % — ABNORMAL HIGH (ref 36.0–46.0)
Hemoglobin: 15.5 g/dL — ABNORMAL HIGH (ref 12.0–15.0)
Lymphocytes Relative: 32.4 % (ref 12.0–46.0)
Lymphs Abs: 3 10*3/uL (ref 0.7–4.0)
MCHC: 33.2 g/dL (ref 30.0–36.0)
MCV: 90 fl (ref 78.0–100.0)
Monocytes Absolute: 0.5 10*3/uL (ref 0.1–1.0)
Monocytes Relative: 5.4 % (ref 3.0–12.0)
Neutro Abs: 5.5 10*3/uL (ref 1.4–7.7)
Neutrophils Relative %: 58.9 % (ref 43.0–77.0)
Platelets: 433 10*3/uL — ABNORMAL HIGH (ref 150.0–400.0)
RBC: 5.17 Mil/uL — ABNORMAL HIGH (ref 3.87–5.11)
RDW: 14 % (ref 11.5–15.5)
WBC: 9.3 10*3/uL (ref 4.0–10.5)

## 2020-12-05 LAB — VITAMIN D 25 HYDROXY (VIT D DEFICIENCY, FRACTURES): VITD: 53.68 ng/mL (ref 30.00–100.00)

## 2020-12-05 LAB — TSH: TSH: 1.45 u[IU]/mL (ref 0.35–5.50)

## 2020-12-05 LAB — HEMOGLOBIN A1C: Hgb A1c MFr Bld: 6.3 % (ref 4.6–6.5)

## 2020-12-05 MED ORDER — HYDROCHLOROTHIAZIDE 12.5 MG PO TABS: 12.5000 mg | ORAL_TABLET | Freq: Every day | ORAL | 1 refills | Status: DC

## 2020-12-05 NOTE — Assessment & Plan Note (Signed)
she opted not to use CPAP machine. Last seen pulmonology 02/2020 whom agreed with reducing BMI. She has been focusing on weight loss and will continue to do so.

## 2020-12-05 NOTE — Assessment & Plan Note (Signed)
Slightly elevated today.  We agreed to continue HCTZ 12.5 mg with a focus on weight loss.  Discussed goal of less than 120/80 with patient.  She will spot check blood pressure at home and call me with readings so we can adjust antihypertensives if needed

## 2020-12-05 NOTE — Assessment & Plan Note (Signed)
Congratulated patient on decreasing smoking.  We will continue to monitor.  She declines PCV 20

## 2020-12-05 NOTE — Assessment & Plan Note (Signed)
Chronic, stable.  Followed with Dr. Nicolasa Ducking.  Continue Celexa, Klonopin and Cymbalta as prescribed

## 2020-12-05 NOTE — Patient Instructions (Signed)
Referral for colonoscopy Let us know if you dont hear back within a week in regards to an appointment being scheduled.   Mammogram is scheduled  It is imperative that you are seen AT least twice per year for labs and monitoring. Monitor blood pressure at home and me 5-6 reading on separate days. Goal is less than 120/80, based on newest guidelines, however we certainly want to be less than 130/80;  if persistently higher, please make sooner follow up appointment so we can recheck you blood pressure and manage/ adjust medications. Managing Your Hypertension Hypertension, also called high blood pressure, is when the force of the blood pressing against the walls of the arteries is too strong. Arteries are blood vessels that carry blood from your heart throughout your body. Hypertension forces the heart to work harder to pump blood and may cause the arteries to become narrow or stiff. Understanding blood pressure readings Your personal target blood pressure may vary depending on your medical conditions, your age, and other factors. A blood pressure reading includes a higher number over a lower number. Ideally, your blood pressure should be below 120/80. You should know that: The first, or top, number is called the systolic pressure. It is a measure of the pressure in your arteries as your heart beats. The second, or bottom number, is called the diastolic pressure. It is a measure of the pressure in your arteries as the heart relaxes. Blood pressure is classified into four stages. Based on your blood pressure reading, your health care provider may use the following stages to determine what type of treatment you need, if any. Systolic pressure and diastolic pressure are measured in a unit called mmHg. Normal Systolic pressure: below 209. Diastolic pressure: below 80. Elevated Systolic pressure: 470-962. Diastolic pressure: below 80. Hypertension stage 1 Systolic pressure: 836-629. Diastolic pressure:  47-65. Hypertension stage 2 Systolic pressure: 465 or above. Diastolic pressure: 90 or above. How can this condition affect me? Managing your hypertension is an important responsibility. Over time, hypertension can damage the arteries and decrease blood flow to important parts of the body, including the brain, heart, and kidneys. Having untreated or uncontrolled hypertension can lead to: A heart attack. A stroke. A weakened blood vessel (aneurysm). Heart failure. Kidney damage. Eye damage. Metabolic syndrome. Memory and concentration problems. Vascular dementia. What actions can I take to manage this condition? Hypertension can be managed by making lifestyle changes and possibly by taking medicines. Your health care provider will help you make a plan to bring your blood pressure within a normal range. Nutrition  Eat a diet that is high in fiber and potassium, and low in salt (sodium), added sugar, and fat. An example eating plan is called the Dietary Approaches to Stop Hypertension (DASH) diet. To eat this way: Eat plenty of fresh fruits and vegetables. Try to fill one-half of your plate at each meal with fruits and vegetables. Eat whole grains, such as whole-wheat pasta, brown rice, or whole-grain bread. Fill about one-fourth of your plate with whole grains. Eat low-fat dairy products. Avoid fatty cuts of meat, processed or cured meats, and poultry with skin. Fill about one-fourth of your plate with lean proteins such as fish, chicken without skin, beans, eggs, and tofu. Avoid pre-made and processed foods. These tend to be higher in sodium, added sugar, and fat. Reduce your daily sodium intake. Most people with hypertension should eat less than 1,500 mg of sodium a day. Lifestyle  Work with your health care provider to  maintain a healthy body weight or to lose weight. Ask what an ideal weight is for you. Get at least 30 minutes of exercise that causes your heart to beat faster (aerobic  exercise) most days of the week. Activities may include walking, swimming, or biking. Include exercise to strengthen your muscles (resistance exercise), such as weight lifting, as part of your weekly exercise routine. Try to do these types of exercises for 30 minutes at least 3 days a week. Do not use any products that contain nicotine or tobacco, such as cigarettes, e-cigarettes, and chewing tobacco. If you need help quitting, ask your health care provider. Control any long-term (chronic) conditions you have, such as high cholesterol or diabetes. Identify your sources of stress and find ways to manage stress. This may include meditation, deep breathing, or making time for fun activities. Alcohol use Do not drink alcohol if: Your health care provider tells you not to drink. You are pregnant, may be pregnant, or are planning to become pregnant. If you drink alcohol: Limit how much you use to: 0-1 drink a day for women. 0-2 drinks a day for men. Be aware of how much alcohol is in your drink. In the [place] one drink equals one 12 oz bottle of beer (355 mL), one 5 oz glass of wine (148 mL), or one 1 oz glass of hard liquor (44 mL). Medicines Your health care provider may prescribe medicine if lifestyle changes are not enough to get your blood pressure under control and if: Your systolic blood pressure is 130 or higher. Your diastolic blood pressure is 80 or higher. Take medicines only as told by your health care provider. Follow the directions carefully. Blood pressure medicines must be taken as told by your health care provider. The medicine does not work as well when you skip doses. Skipping doses also puts you at risk for problems. Monitoring Before you monitor your blood pressure: Do not smoke, drink caffeinated beverages, or exercise within 30 minutes before taking a measurement. Use the bathroom and empty your bladder (urinate). Sit quietly for at least 5 minutes before taking  measurements. Monitor your blood pressure at home as told by your health care provider. To do this: Sit with your back straight and supported. Place your feet flat on the floor. Do not cross your legs. Support your arm on a flat surface, such as a table. Make sure your upper arm is at heart level. Each time you measure, take two or three readings one minute apart and record the results. You may also need to have your blood pressure checked regularly by your health care provider. General information Talk with your health care provider about your diet, exercise habits, and other lifestyle factors that may be contributing to hypertension. Review all the medicines you take with your health care provider because there may be side effects or interactions. Keep all visits as told by your health care provider. Your health care provider can help you create and adjust your plan for managing your high blood pressure. Where to find more information National Heart, Lung, and Blood Institute: https://wilson-eaton.com/ American Heart Association: www.heart.org Contact a health care provider if: You think you are having a reaction to medicines you have taken. You have repeated (recurrent) headaches. You feel dizzy. You have swelling in your ankles. You have trouble with your vision. Get help right away if: You develop a severe headache or confusion. You have unusual weakness or numbness, or you feel faint. You have severe pain  in your chest or abdomen. You vomit repeatedly. You have trouble breathing. These symptoms may represent a serious problem that is an emergency. Do not wait to see if the symptoms will go away. Get medical help right away. Call your local emergency services (911 in the [place] Do not drive yourself to the hospital. Summary Hypertension is when the force of blood pumping through your arteries is too strong. If this condition is not controlled, it may put you at risk for serious  complications. Your personal target blood pressure may vary depending on your medical conditions, your age, and other factors. For most people, a normal blood pressure is less than 120/80. Hypertension is managed by lifestyle changes, medicines, or both. Lifestyle changes to help manage hypertension include losing weight, eating a healthy, low-sodium diet, exercising more, stopping smoking, and limiting alcohol. This information is not intended to replace advice given to you by your health care provider. Make sure you discuss any questions you have with your health care provider. Document Revised: 03/26/2019 Document Reviewed: 01/19/2019 Elsevier Patient Education  2022 [employer].

## 2020-12-05 NOTE — Progress Notes (Signed)
Subjective:    Patient ID: Crystal Leonard, female    DOB: 27-May-1970, 50 y.o.   MRN: [id number]  CC: Crystal Leonard is a 50 y.o. female who presents today for follow up.   HPI: Feels well today.  No new complaints  Walking daily and practicing yoga. No cp, sob.   She has lost total of 20 lbs this year with low carb diet.   HTN- compliant with hctz 12.5mg   Depression- following Dr Nicolasa Ducking who prescribes Celexa, Klonopin, Cymbalta   OSA-she opted not to use CPAP machine.  She has been focusing on weight loss Last seen pulmonology 02/2020 whom agreed with reducing BMI  Smoker. She has deceased and now smoking only when drinks alcohol. She may have a cigarette 2-3 with an alcoholic beverage 2-3 times per month.  Due pcv20; she declines at this time.  HISTORY:  Past Medical History:  Diagnosis Date   Chicken pox    Depression    Headache    Frequent headaches   Hypertension    Kidney stones    Migraine    UTI (lower urinary tract infection)    Past Surgical History:  Procedure Laterality Date   ABDOMINAL HYSTERECTOMY  2012   Due to pelvic pain, endometriosis. No GYN cancer. She had one abnormal pap smear in 20's. She has ovaries.   LAPAROSCOPIC ENDOMETRIOSIS FULGURATION     TONSILLECTOMY AND ADENOIDECTOMY     Family History  Problem Relation Age of Onset   Hypertension Mother    Cancer Father        prostate   Hypertension Maternal Grandmother    Diabetes Brother    Diabetes Maternal Aunt     Allergies: Tetanus toxoids Current Outpatient Medications on File Prior to Visit  Medication Sig Dispense Refill   albuterol (PROVENTIL HFA) 108 (90 Base) MCG/ACT inhaler Inhale 2 puffs into the lungs every 6 (six) hours as needed for wheezing or shortness of breath. 18 g 2   citalopram (CELEXA) 40 MG tablet Take 40 mg by mouth daily.     clonazePAM (KLONOPIN) 0.5 MG tablet Take by mouth daily as needed.     DULoxetine (CYMBALTA) 60 MG capsule Take 60 mg by mouth daily.      Multiple Vitamin (MULTIVITAMIN) tablet Take 1 tablet by mouth daily.     omeprazole (PRILOSEC) 20 MG capsule Take 20 mg by mouth as needed.     No current facility-administered medications on file prior to visit.    Social History   Tobacco Use   Smoking status: Some Days    Packs/day: 1.00    Years: 20.00    Pack years: 20.00    Types: Cigarettes   Smokeless tobacco: Never   Tobacco comments:    0.5 pack per week- 01/25/2020  Vaping Use   Vaping Use: Never used  Substance Use Topics   Alcohol use: Yes    Alcohol/week: 0.0 standard drinks    Comment: Rare occasions    Drug use: No    Review of Systems  Constitutional:  Negative for chills and fever.  Respiratory:  Negative for cough.   Cardiovascular:  Negative for chest pain and palpitations.  Gastrointestinal:  Negative for nausea and vomiting.     Objective:    BP 130/88 (BP Location: Left Arm, Patient Position: Sitting, Cuff Size: Large)   Pulse 81   Ht 5\' 6"  (1.676 m)   Wt 243 lb 6.4 oz (110.4 kg)   SpO2 99%  BMI 39.29 kg/m  BP Readings from Last 3 Encounters:  12/05/20 130/88  09/11/20 139/84  03/31/20 (!) 155/93   Wt Readings from Last 3 Encounters:  12/05/20 243 lb 6.4 oz (110.4 kg)  03/31/20 250 lb (113.4 kg)  01/25/20 255 lb 3.2 oz (115.8 kg)    Physical Exam Vitals reviewed.  Constitutional:      Appearance: She is well-developed.  Eyes:     Conjunctiva/sclera: Conjunctivae normal.  Cardiovascular:     Rate and Rhythm: Normal rate and regular rhythm.     Pulses: Normal pulses.     Heart sounds: Normal heart sounds.  Pulmonary:     Effort: Pulmonary effort is normal.     Breath sounds: Normal breath sounds. No wheezing, rhonchi or rales.  Skin:    General: Skin is warm and dry.  Neurological:     Mental Status: She is alert.  Psychiatric:        Speech: Speech normal.        Behavior: Behavior normal.        Thought Content: Thought content normal.       Assessment & Plan:    Problem List Items Addressed This Visit       Cardiovascular and Mediastinum   HTN (hypertension)    Slightly elevated today.  We agreed to continue HCTZ 12.5 mg with a focus on weight loss.  Discussed goal of less than 120/80 with patient.  She will spot check blood pressure at home and call me with readings so we can adjust antihypertensives if needed      Relevant Medications   hydrochlorothiazide (HYDRODIURIL) 12.5 MG tablet     Respiratory   OSA (obstructive sleep apnea)    she opted not to use CPAP machine. Last seen pulmonology 02/2020 whom agreed with reducing BMI. She has been focusing on weight loss and will continue to do so.          Other   Depression, major, single episode, complete remission (HCC)    Chronic, stable.  Followed with Dr. Nicolasa Ducking.  Continue Celexa, Klonopin and Cymbalta as prescribed      Tobacco use disorder    Congratulated patient on decreasing smoking.  We will continue to monitor.  She declines PCV 20      Other Visit Diagnoses     Need for immunization against influenza    -  Primary   Relevant Orders   Flu Vaccine QUAD 92mo+IM (Fluarix, Fluzone & Alfiuria Quad PF) (Completed)   Essential hypertension       Relevant Medications   hydrochlorothiazide (HYDRODIURIL) 12.5 MG tablet   Screen for colon cancer       Relevant Orders   Ambulatory referral to Gastroenterology   Routine general medical examination at health care facility       Relevant Orders   VITAMIN D 25 Hydroxy (Vit-D Deficiency, Fractures)   Hemoglobin A1c   TSH   CBC with Differential/Platelet   Comprehensive metabolic panel   Lipid panel   Encounter for screening mammogram for malignant neoplasm of breast       Relevant Orders   MM 3D SCREEN BREAST BILATERAL        I have discontinued Caly M. Denker's Symbicort and oxyCODONE-acetaminophen. I have also changed her hydrochlorothiazide. Additionally, I am having her maintain her omeprazole, multivitamin,  DULoxetine, albuterol, clonazePAM, and citalopram.   Meds ordered this encounter  Medications   hydrochlorothiazide (HYDRODIURIL) 12.5 MG tablet    Sig: Take  1 tablet (12.5 mg total) by mouth daily.    Dispense:  90 tablet    Refill:  1    Order Specific Question:   Supervising Provider    Answer:   Crecencio Mc [[id number]]    Return precautions given.   Risks, benefits, and alternatives of the medications and treatment plan prescribed today were discussed, and patient expressed understanding.   Education regarding symptom management and diagnosis given to patient on AVS.  Continue to follow with Burnard Hawthorne, FNP for routine health maintenance.   Crystal Leonard and I agreed with plan.   Mable Paris, FNP

## 2020-12-06 ENCOUNTER — Telehealth: Payer: Self-pay

## 2020-12-06 NOTE — Telephone Encounter (Signed)
CALLED PATIENT NO ANSWER LEFT VOICEMAIL FOR A CALL BACK ? ?

## 2020-12-12 ENCOUNTER — Ambulatory Visit
Admission: RE | Admit: 2020-12-12 | Discharge: 2020-12-12 | Disposition: A | Discharge status: Home / Self Care | Payer: BC Managed Care – PPO | Source: Ambulatory Visit | Attending: Family | Admitting: Family

## 2020-12-12 ENCOUNTER — Other Ambulatory Visit: Payer: Self-pay

## 2020-12-12 DIAGNOSIS — Z1231 Encounter for screening mammogram for malignant neoplasm of breast: Secondary | ICD-10-CM

## 2020-12-20 ENCOUNTER — Ambulatory Visit: Payer: BC Managed Care – PPO | Admitting: Family

## 2021-01-24 ENCOUNTER — Other Ambulatory Visit: Payer: Self-pay

## 2021-01-24 ENCOUNTER — Other Ambulatory Visit (HOSPITAL_COMMUNITY)
Admission: RE | Admit: 2021-01-24 | Discharge: 2021-01-24 | Disposition: A | Discharge status: Home / Self Care | Payer: BC Managed Care – PPO | Source: Ambulatory Visit | Attending: Family | Admitting: Family

## 2021-01-24 ENCOUNTER — Encounter: Payer: Self-pay | Admitting: Family

## 2021-01-24 ENCOUNTER — Ambulatory Visit (INDEPENDENT_AMBULATORY_CARE_PROVIDER_SITE_OTHER): Payer: BC Managed Care – PPO | Admitting: Family

## 2021-01-24 VITALS — BP 110/80 | HR 88 | Temp 98.4°F | Ht 66.0 in | Wt 248.0 lb

## 2021-01-24 DIAGNOSIS — D751 Secondary polycythemia: Secondary | ICD-10-CM

## 2021-01-24 DIAGNOSIS — Z0000 Encounter for general adult medical examination without abnormal findings: Secondary | ICD-10-CM | POA: Insufficient documentation

## 2021-01-24 DIAGNOSIS — E785 Hyperlipidemia, unspecified: Secondary | ICD-10-CM | POA: Diagnosis not present

## 2021-01-24 DIAGNOSIS — I10 Essential (primary) hypertension: Secondary | ICD-10-CM | POA: Diagnosis not present

## 2021-01-24 NOTE — Progress Notes (Signed)
Subjective:    Patient ID: Crystal Leonard, female    DOB: April 13, 1970, 50 y.o.   MRN: [id number]  CC: Crystal Leonard is a 50 y.o. female who presents today for physical exam.    HPI: Feels well today.  No complaints. She continues to smoke.  She has carbon monoxide detectors in her home  Compliant with HCTZ 12.5 mg.  Denies chest pain Referral to hematology Prediabetes  Hypertension-compliant with hydrochlorothiazide 12.5 mg Colorectal Cancer Screening: overdue; she been contacted and letter mailed from [facility].  Breast Cancer Screening: Mammogram UTD Cervical Cancer Screening: due. H/o hysterectomy due to suspected endometriosis. She thinks she still ovaries. Unsure if she has her cervix.    Lung Cancer Screening: Doesn't have 20 year pack year history and age > 36 years yo 47 years.         Tetanus - UTD  Labs: Screening labs done prior Exercise: Gets regular exercise.   Alcohol use:  occasional Smoking/tobacco use: smoker.     HISTORY:  Past Medical History:  Diagnosis Date   Chicken pox    Depression    Headache    Frequent headaches   Hypertension    Kidney stones    Migraine    UTI (lower urinary tract infection)     Past Surgical History:  Procedure Laterality Date   ABDOMINAL HYSTERECTOMY  2012   Due to pelvic pain, endometriosis. No GYN cancer. She had one abnormal pap smear in 20's. She has ovaries.NO CERVIX on exam 01/24/21   LAPAROSCOPIC ENDOMETRIOSIS FULGURATION     TONSILLECTOMY AND ADENOIDECTOMY     Family History  Problem Relation Age of Onset   Hypertension Mother    Cancer Father        prostate   Diabetes Brother    Hypertension Maternal Grandmother    Diabetes Maternal Aunt    Heart attack Neg Hx       ALLERGIES: Tetanus toxoids  Current Outpatient Medications on File Prior to Visit  Medication Sig Dispense Refill   albuterol (PROVENTIL HFA) 108 (90 Base) MCG/ACT inhaler Inhale 2 puffs into the lungs every 6 (six) hours as  needed for wheezing or shortness of breath. 18 g 2   citalopram (CELEXA) 40 MG tablet Take 40 mg by mouth daily.     clonazePAM (KLONOPIN) 0.5 MG tablet Take by mouth daily as needed.     DULoxetine (CYMBALTA) 60 MG capsule Take 60 mg by mouth daily.     hydrochlorothiazide (HYDRODIURIL) 12.5 MG tablet Take 1 tablet (12.5 mg total) by mouth daily. 90 tablet 1   Multiple Vitamin (MULTIVITAMIN) tablet Take 1 tablet by mouth daily.     omeprazole (PRILOSEC) 20 MG capsule Take 20 mg by mouth as needed.     No current facility-administered medications on file prior to visit.    Social History   Tobacco Use   Smoking status: Some Days    Packs/day: 1.00    Years: 20.00    Pack years: 20.00    Types: Cigarettes   Smokeless tobacco: Never   Tobacco comments:    0.5 pack per week- 01/25/2020  Vaping Use   Vaping Use: Never used  Substance Use Topics   Alcohol use: Yes    Alcohol/week: 0.0 standard drinks    Comment: Rare occasions    Drug use: No    Review of Systems  Constitutional:  Negative for chills, fever and unexpected weight change.  HENT:  Negative for congestion.  Respiratory:  Negative for cough.   Cardiovascular:  Negative for chest pain, palpitations and leg swelling.  Gastrointestinal:  Negative for abdominal pain, nausea and vomiting.  Genitourinary:  Negative for pelvic pain and vaginal pain.  Musculoskeletal:  Negative for arthralgias and myalgias.  Skin:  Negative for rash.  Neurological:  Negative for headaches.  Hematological:  Negative for adenopathy.  Psychiatric/Behavioral:  Negative for confusion.      Objective:    BP 110/80 (BP Location: Left Arm, Patient Position: Sitting, Cuff Size: Large)   Pulse 88   Temp 98.4 F (36.9 C) (Oral)   Ht 5\' 6"  (1.676 m)   Wt 248 lb (112.5 kg)   SpO2 98%   BMI 40.03 kg/m   BP Readings from Last 3 Encounters:  01/24/21 110/80  12/05/20 130/88  09/11/20 139/84   Wt Readings from Last 3 Encounters:  01/24/21  248 lb (112.5 kg)  12/05/20 243 lb 6.4 oz (110.4 kg)  03/31/20 250 lb (113.4 kg)    Physical Exam Vitals reviewed.  Constitutional:      Appearance: Normal appearance. She is well-developed.  HENT:     Head: Normocephalic and atraumatic.     Nose: Nose normal. No rhinorrhea.     Right Sinus: No maxillary sinus tenderness or frontal sinus tenderness.     Left Sinus: No maxillary sinus tenderness or frontal sinus tenderness.     Mouth/Throat:     Pharynx: Uvula midline. No posterior oropharyngeal erythema.  Eyes:     General: Lids are normal. Lids are everted, no foreign bodies appreciated.     Conjunctiva/sclera: Conjunctivae normal.     Pupils: Pupils are equal, round, and reactive to light.     Comments: Normal fundus bilaterally   Neck:     Thyroid: No thyroid mass or thyromegaly.  Cardiovascular:     Rate and Rhythm: Normal rate and regular rhythm.     Pulses: Normal pulses.     Heart sounds: Normal heart sounds.  Pulmonary:     Effort: Pulmonary effort is normal.     Breath sounds: Normal breath sounds. No wheezing, rhonchi or rales.  Chest:  Breasts:    Breasts are symmetrical.     Right: No inverted nipple, mass, nipple discharge, skin change or tenderness.     Left: No inverted nipple, mass, nipple discharge, skin change or tenderness.  Abdominal:     General: Bowel sounds are normal. There is no distension.     Palpations: Abdomen is soft. Abdomen is not rigid. There is no fluid wave or mass.     Tenderness: There is no abdominal tenderness. There is no guarding or rebound.  Genitourinary:    Cervix: No cervical bleeding.     Uterus: Absent.      Adnexa:        Right: No mass, tenderness or fullness.         Left: No mass, tenderness or fullness.       Comments: Pap performed of vagina. No cervix present.  No CMT. Unable to appreciated ovaries. Lymphadenopathy:     Head:     Right side of head: No submental, submandibular, tonsillar, preauricular, posterior  auricular or occipital adenopathy.     Left side of head: No submental, submandibular, tonsillar, preauricular, posterior auricular or occipital adenopathy.     Cervical: No cervical adenopathy.     Right cervical: No superficial, deep or posterior cervical adenopathy.    Left cervical: No superficial, deep or posterior  cervical adenopathy.     Upper Body:     Right upper body: No pectoral adenopathy.     Left upper body: No pectoral adenopathy.  Skin:    General: Skin is warm and dry.  Neurological:     Mental Status: She is alert.  Psychiatric:        Speech: Speech normal.        Behavior: Behavior normal.        Thought Content: Thought content normal.       Assessment & Plan:   Problem List Items Addressed This Visit       Cardiovascular and Mediastinum   HTN (hypertension)    Chronic, stable.  Continue HCTZ 12.5 mg        Other   Annual physical exam - Primary    Referral for colonoscopy in place as patient prefers to return to [place].  Clinical breast exam performed.  Mammogram is up-to-date.  During pelvic exam, I do not appreciate cervix.  Pap collected of vaginal wall.  No further cervical cancer screening needed as long as Pap smear returns normal cytology, without cervical cells.      Relevant Orders   Cytology - PAP   Ambulatory referral to Gastroenterology   Erythrocytosis    Elevated.  Discussed possible etiologies of smoking, sleep apnea.  Patient politely declines hematology referral at this time for further evaluation for blood disease, malignancy.  She prefers to recheck in a couple of months.  I reordered CBC for her to schedule.      Relevant Orders   CBC with Differential/Platelet   Hyperlipidemia    Discussed elevated LDL cholesterol and her labs today.  Patient has no early ASCVD in her family.  She politely declines further restratification, calcium scoring with cardiology at this time.  Continue to monitor        I am having Alvin Critchley.  Rodden maintain her omeprazole, multivitamin, DULoxetine, albuterol, clonazePAM, citalopram, and hydrochlorothiazide.   No orders of the defined types were placed in this encounter.   Return precautions given.   Risks, benefits, and alternatives of the medications and treatment plan prescribed today were discussed, and patient expressed understanding.   Education regarding symptom management and diagnosis given to patient on AVS.   Continue to follow with Burnard Hawthorne, FNP for routine health maintenance.   Crystal Leonard and I agreed with plan.   Mable Paris, FNP

## 2021-01-24 NOTE — Assessment & Plan Note (Signed)
Discussed elevated LDL cholesterol and her labs today.  Patient has no early ASCVD in her family.  She politely declines further restratification, calcium scoring with cardiology at this time.  Continue to monitor

## 2021-01-24 NOTE — Assessment & Plan Note (Signed)
Elevated.  Discussed possible etiologies of smoking, sleep apnea.  Patient politely declines hematology referral at this time for further evaluation for blood disease, malignancy.  She prefers to recheck in a couple of months.  I reordered CBC for her to schedule.

## 2021-01-24 NOTE — Assessment & Plan Note (Signed)
Referral for colonoscopy in place as patient prefers to return to [place].  Clinical breast exam performed.  Mammogram is up-to-date.  During pelvic exam, I do not appreciate cervix.  Pap collected of vaginal wall.  No further cervical cancer screening needed as long as Pap smear returns normal cytology, without cervical cells.

## 2021-01-24 NOTE — Assessment & Plan Note (Signed)
Chronic, stable.  Continue HCTZ 12.5 mg 

## 2021-01-24 NOTE — Patient Instructions (Addendum)
As discussed, we will recheck elevation and red blood cells, hemoglobin in 3 months time. please schedule your lab.  Referral for colonoscopy.   Let us know if you dont hear back within a week in regards to an appointment being scheduled.    Nice to see you!  Health Maintenance, Female Adopting a healthy lifestyle and getting preventive care are important in promoting health and wellness. Ask your health care provider about: The right schedule for you to have regular tests and exams. Things you can do on your own to prevent diseases and keep yourself healthy. What should I know about diet, weight, and exercise? Eat a healthy diet  Eat a diet that includes plenty of vegetables, fruits, low-fat dairy products, and lean protein. Do not eat a lot of foods that are high in solid fats, added sugars, or sodium. Maintain a healthy weight Body mass index (BMI) is used to identify weight problems. It estimates body fat based on height and weight. Your health care provider can help determine your BMI and help you achieve or maintain a healthy weight. Get regular exercise Get regular exercise. This is one of the most important things you can do for your health. Most adults should: Exercise for at least 150 minutes each week. The exercise should increase your heart rate and make you sweat (moderate-intensity exercise). Do strengthening exercises at least twice a week. This is in addition to the moderate-intensity exercise. Spend less time sitting. Even light physical activity can be beneficial. Watch cholesterol and blood lipids Have your blood tested for lipids and cholesterol at 49 years of age, then have this test every 5 years. Have your cholesterol levels checked more often if: Your lipid or cholesterol levels are high. You are older than 49 years of age. You are at high risk for heart disease. What should I know about cancer screening? Depending on your health history and family history, you  may need to have cancer screening at various ages. This may include screening for: Breast cancer. Cervical cancer. Colorectal cancer. Skin cancer. Lung cancer. What should I know about heart disease, diabetes, and high blood pressure? Blood pressure and heart disease High blood pressure causes heart disease and increases the risk of stroke. This is more likely to develop in people who have high blood pressure readings or are overweight. Have your blood pressure checked: Every 3-5 years if you are 77-40 years of age. Every year if you are 94 years old or older. Diabetes Have regular diabetes screenings. This checks your fasting blood sugar level. Have the screening done: Once every three years after age 23 if you are at a normal weight and have a low risk for diabetes. More often and at a younger age if you are overweight or have a high risk for diabetes. What should I know about preventing infection? Hepatitis B If you have a higher risk for hepatitis B, you should be screened for this virus. Talk with your health care provider to find out if you are at risk for hepatitis B infection. Hepatitis C Testing is recommended for: Everyone born from [birth year] through 1964. Anyone with known risk factors for hepatitis C. Sexually transmitted infections (STIs) Get screened for STIs, including gonorrhea and chlamydia, if: You are sexually active and are younger than 49 years of age. You are older than 49 years of age and your health care provider tells you that you are at risk for this type of infection. Your sexual activity has changed since  you were last screened, and you are at increased risk for chlamydia or gonorrhea. Ask your health care provider if you are at risk. Ask your health care provider about whether you are at high risk for HIV. Your health care provider may recommend a prescription medicine to help prevent HIV infection. If you choose to take medicine to prevent HIV, you should first  get tested for HIV. You should then be tested every 3 months for as long as you are taking the medicine. Pregnancy If you are about to stop having your period (premenopausal) and you may become pregnant, seek counseling before you get pregnant. Take 400 to 800 micrograms (mcg) of folic acid every day if you become pregnant. Ask for birth control (contraception) if you want to prevent pregnancy. Osteoporosis and menopause Osteoporosis is a disease in which the bones lose minerals and strength with aging. This can result in bone fractures. If you are 65 years old or older, or if you are at risk for osteoporosis and fractures, ask your health care provider if you should: Be screened for bone loss. Take a calcium or vitamin D supplement to lower your risk of fractures. Be given hormone replacement therapy (HRT) to treat symptoms of menopause. Follow these instructions at home: Alcohol use Do not drink alcohol if: Your health care provider tells you not to drink. You are pregnant, may be pregnant, or are planning to become pregnant. If you drink alcohol: Limit how much you have to: 0-1 drink a day. Know how much alcohol is in your drink. In the [place] one drink equals one 12 oz bottle of beer (355 mL), one 5 oz glass of wine (148 mL), or one 1 oz glass of hard liquor (44 mL). Lifestyle Do not use any products that contain nicotine or tobacco. These products include cigarettes, chewing tobacco, and vaping devices, such as e-cigarettes. If you need help quitting, ask your health care provider. Do not use street drugs. Do not share needles. Ask your health care provider for help if you need support or information about quitting drugs. General instructions Schedule regular health, dental, and eye exams. Stay current with your vaccines. Tell your health care provider if: You often feel depressed. You have ever been abused or do not feel safe at home. Summary Adopting a healthy lifestyle and  getting preventive care are important in promoting health and wellness. Follow your health care provider's instructions about healthy diet, exercising, and getting tested or screened for diseases. Follow your health care provider's instructions on monitoring your cholesterol and blood pressure. This information is not intended to replace advice given to you by your health care provider. Make sure you discuss any questions you have with your health care provider. Document Revised: 07/10/2020 Document Reviewed: 07/10/2020 Elsevier Patient Education  [place].

## 2021-01-29 LAB — CYTOLOGY - PAP
Comment: NEGATIVE
Diagnosis: NEGATIVE
High risk HPV: NEGATIVE

## 2021-02-26 ENCOUNTER — Encounter: Payer: Self-pay | Admitting: Emergency Medicine

## 2021-02-26 ENCOUNTER — Other Ambulatory Visit: Payer: Self-pay

## 2021-02-26 DIAGNOSIS — K5900 Constipation, unspecified: Secondary | ICD-10-CM | POA: Diagnosis not present

## 2021-02-26 DIAGNOSIS — R1084 Generalized abdominal pain: Secondary | ICD-10-CM | POA: Insufficient documentation

## 2021-02-26 DIAGNOSIS — Z8616 Personal history of COVID-19: Secondary | ICD-10-CM | POA: Diagnosis not present

## 2021-02-26 DIAGNOSIS — R110 Nausea: Secondary | ICD-10-CM | POA: Diagnosis not present

## 2021-02-26 DIAGNOSIS — R300 Dysuria: Secondary | ICD-10-CM | POA: Diagnosis not present

## 2021-02-26 DIAGNOSIS — Z79899 Other long term (current) drug therapy: Secondary | ICD-10-CM | POA: Insufficient documentation

## 2021-02-26 DIAGNOSIS — R109 Unspecified abdominal pain: Secondary | ICD-10-CM | POA: Diagnosis present

## 2021-02-26 DIAGNOSIS — I10 Essential (primary) hypertension: Secondary | ICD-10-CM | POA: Diagnosis not present

## 2021-02-26 DIAGNOSIS — F17210 Nicotine dependence, cigarettes, uncomplicated: Secondary | ICD-10-CM | POA: Insufficient documentation

## 2021-02-26 DIAGNOSIS — Z85828 Personal history of other malignant neoplasm of skin: Secondary | ICD-10-CM | POA: Insufficient documentation

## 2021-02-26 LAB — URINALYSIS, MICROSCOPIC (REFLEX)

## 2021-02-26 LAB — URINALYSIS, ROUTINE W REFLEX MICROSCOPIC
Bilirubin Urine: NEGATIVE
Glucose, UA: NEGATIVE mg/dL
Leukocytes,Ua: NEGATIVE
Nitrite: NEGATIVE
Protein, ur: 30 mg/dL — AB
Specific Gravity, Urine: 1.025 (ref 1.005–1.030)
pH: 6 (ref 5.0–8.0)

## 2021-02-26 LAB — CBC WITH DIFFERENTIAL/PLATELET
Abs Immature Granulocytes: 0.07 10*3/uL (ref 0.00–0.07)
Basophils Absolute: 0.1 10*3/uL (ref 0.0–0.1)
Basophils Relative: 0 %
Eosinophils Absolute: 0.2 10*3/uL (ref 0.0–0.5)
Eosinophils Relative: 1 %
HCT: 47.6 % — ABNORMAL HIGH (ref 36.0–46.0)
Hemoglobin: 15.8 g/dL — ABNORMAL HIGH (ref 12.0–15.0)
Immature Granulocytes: 0 %
Lymphocytes Relative: 18 %
Lymphs Abs: 3.1 10*3/uL (ref 0.7–4.0)
MCH: 29.8 pg (ref 26.0–34.0)
MCHC: 33.2 g/dL (ref 30.0–36.0)
MCV: 89.8 fL (ref 80.0–100.0)
Monocytes Absolute: 0.8 10*3/uL (ref 0.1–1.0)
Monocytes Relative: 5 %
Neutro Abs: 13.2 10*3/uL — ABNORMAL HIGH (ref 1.7–7.7)
Neutrophils Relative %: 76 %
Platelets: 422 10*3/uL — ABNORMAL HIGH (ref 150–400)
RBC: 5.3 MIL/uL — ABNORMAL HIGH (ref 3.87–5.11)
RDW: 13.3 % (ref 11.5–15.5)
WBC: 17.4 10*3/uL — ABNORMAL HIGH (ref 4.0–10.5)
nRBC: 0 % (ref 0.0–0.2)

## 2021-02-26 LAB — COMPREHENSIVE METABOLIC PANEL
ALT: 17 U/L (ref 0–44)
AST: 18 U/L (ref 15–41)
Albumin: 3.8 g/dL (ref 3.5–5.0)
Alkaline Phosphatase: 64 U/L (ref 38–126)
Anion gap: 7 (ref 5–15)
BUN: 18 mg/dL (ref 6–20)
CO2: 26 mmol/L (ref 22–32)
Calcium: 8.9 mg/dL (ref 8.9–10.3)
Chloride: 104 mmol/L (ref 98–111)
Creatinine, Ser: 0.9 mg/dL (ref 0.44–1.00)
GFR, Estimated: >60 mL/min (ref >60)
Glucose, Bld: 137 mg/dL — ABNORMAL HIGH (ref 70–99)
Potassium: 3.4 mmol/L — ABNORMAL LOW (ref 3.5–5.1)
Sodium: 137 mmol/L (ref 135–145)
Total Bilirubin: 0.5 mg/dL (ref 0.3–1.2)
Total Protein: 7.4 g/dL (ref 6.5–8.1)

## 2021-02-26 LAB — LIPASE, BLOOD: Lipase: 44 U/L (ref 11–51)

## 2021-02-26 NOTE — ED Triage Notes (Signed)
Patient ambulatory to triage with steady gait, without difficulty or distress noted; pt reports since this am having lower abd/back pain accomp by nausea

## 2021-02-27 ENCOUNTER — Emergency Department: Payer: BC Managed Care – PPO

## 2021-02-27 ENCOUNTER — Emergency Department
Admission: EM | Admit: 2021-02-27 | Discharge: 2021-02-27 | Disposition: A | Discharge status: Home / Self Care | Payer: BC Managed Care – PPO | Attending: Emergency Medicine | Admitting: Emergency Medicine

## 2021-02-27 DIAGNOSIS — R1084 Generalized abdominal pain: Secondary | ICD-10-CM

## 2021-02-27 DIAGNOSIS — R300 Dysuria: Secondary | ICD-10-CM

## 2021-02-27 DIAGNOSIS — K5900 Constipation, unspecified: Secondary | ICD-10-CM

## 2021-02-27 MED ORDER — CEPHALEXIN 500 MG PO CAPS: 500.0000 mg | ORAL_CAPSULE | Freq: Two times a day (BID) | ORAL | 0 refills | Status: DC

## 2021-02-27 NOTE — Discharge Instructions (Addendum)
You were seen in the emergency department today for abdominal pain that we believe is related to constipation.  We recommend that you use one or more of the following over-the-counter medications in the order described:   1)  Miralax (powder):  This medication works by drawing additional fluid into your intestines and helps to flush out your stool.  Mix the powder with water or juice according to label instructions.  Be sure to use the recommended amount of water or juice when you mix up the powder.  Plenty of fluids will help to prevent constipation. 2)  Colace (or Dulcolax) 100 mg:  This is a stool softener, and you may take it once or twice a day as needed. 3)  Senna tablets:  This is a bowel stimulant that will help "push" out your stool. It is the next step to add after you have tried a stool softener.  You may also want to consider using glycerin suppositories, which you insert into your rectum.  You hold it in place and is dissolves and softens your stool and stimulates your bowels.  You could also consider using an enema, which is also available over the counter.  Remember that narcotic pain medications are constipating, so avoid them or minimize their use.  Drink plenty of fluids.  Given the pain you are experiencing after urination and a questionable urinalysis, we are also treating you for a probable urinary tract infection.  Please take the full course of treatment as prescribed unless told otherwise by another provider.  Please return to the Emergency Department immediately if you develop new or worsening symptoms that concern you, such as (but not limited to) fever > 101 degrees, severe abdominal pain, or persistent vomiting.

## 2021-02-27 NOTE — ED Provider Notes (Signed)
[facility] Emergency Department Provider Note  ____________________________________________   Event Date/Time   First MD Initiated Contact with Patient 02/27/21 [phone]     (approximate)  I have reviewed the triage vital signs and the nursing notes.   HISTORY  Chief Complaint Abdominal Pain    HPI Crystal Leonard is a 50 y.o. female  who presents for evaluation of lower abd and back pain starting yesterday morning.  Associated with nausea, no vomiting.  Normal bowel movement in the morning with no change of symptoms.  Comes in waves, waxes and wanes in severity.  Currently asymptomatic.  Denies fever, SOB, chest pain.  Also has been having dysuria recently.  No recent antibiotics nor other medications including narcotics.         Past Medical History:  Diagnosis Date   Chicken pox    Depression    Headache    Frequent headaches   Hypertension    Kidney stones    Migraine    UTI (lower urinary tract infection)     Patient Active Problem List   Diagnosis Date Noted   Erythrocytosis 01/24/2021   Depression, major, single episode, complete remission ([place]) 12/05/2020   OSA (obstructive sleep apnea) 01/25/2020   Annual physical exam 01/25/2020   History of COVID-19 01/25/2020   Bacterial sinusitis 09/15/2019   Skin cancer 05/09/2017   Prediabetes 05/09/2017   Hyperlipidemia 05/09/2017   Chronic right-sided low back pain with right-sided sciatica 05/09/2017   Kidney stone 05/09/2017   Cervicalgia 05/09/2017   Chronic pain of left knee 09/01/2016   Cough present for greater than 3 weeks 08/02/2015   Paresthesia of both hands 07/06/2014   HTN (hypertension) 05/07/2014   Depression 05/07/2014   Migraines 05/07/2014   Tobacco use disorder 05/07/2014    Past Surgical History:  Procedure Laterality Date   ABDOMINAL HYSTERECTOMY  2012   Due to pelvic pain, endometriosis. No GYN cancer. She had one abnormal pap smear in 20's. She has ovaries.NO  CERVIX on exam 01/24/21   LAPAROSCOPIC ENDOMETRIOSIS FULGURATION     TONSILLECTOMY AND ADENOIDECTOMY      Prior to Admission medications   Medication Sig Start Date End Date Taking? Authorizing Provider  cephALEXin (KEFLEX) 500 MG capsule Take 1 capsule (500 mg total) by mouth 2 (two) times daily. 02/27/21  Yes Hinda Kehr, MD  albuterol (PROVENTIL HFA) 108 (90 Base) MCG/ACT inhaler Inhale 2 puffs into the lungs every 6 (six) hours as needed for wheezing or shortness of breath. 04/09/19   McLean-Scocuzza, Nino Glow, MD  citalopram (CELEXA) 40 MG tablet Take 40 mg by mouth daily. 11/09/19   [provider]  clonazePAM (KLONOPIN) 0.5 MG tablet Take by mouth daily as needed. 07/20/19   [provider]  DULoxetine (CYMBALTA) 60 MG capsule Take 60 mg by mouth daily.    [provider]  hydrochlorothiazide (HYDRODIURIL) 12.5 MG tablet Take 1 tablet (12.5 mg total) by mouth daily. 12/05/20   Burnard Hawthorne, FNP  Multiple Vitamin (MULTIVITAMIN) tablet Take 1 tablet by mouth daily.    [provider]  omeprazole (PRILOSEC) 20 MG capsule Take 20 mg by mouth as needed.    [provider]    Allergies Tetanus toxoids  Family History  Problem Relation Age of Onset   Hypertension Mother    Cancer Father        prostate   Diabetes Brother    Hypertension Maternal Grandmother    Diabetes Maternal Aunt  Heart attack Neg Hx     Social History Social History   Tobacco Use   Smoking status: Some Days    Packs/day: 1.00    Years: 20.00    Pack years: 20.00    Types: Cigarettes   Smokeless tobacco: Never   Tobacco comments:    0.5 pack per week- 01/25/2020  Vaping Use   Vaping Use: Never used  Substance Use Topics   Alcohol use: Yes    Alcohol/week: 0.0 standard drinks    Comment: Rare occasions    Drug use: No    Review of Systems Constitutional: No fever/chills Eyes: No visual changes. ENT: No sore throat. Cardiovascular: Denies chest  pain. Respiratory: Denies shortness of breath. Gastrointestinal: Waxing/waning lower abd pain with nausea.   Genitourinary: +dysuria. Musculoskeletal: Waxing/waning lower abd/back pain with nausea. Integumentary: Negative for rash. Neurological: Negative for headaches, focal weakness or numbness.   ____________________________________________   PHYSICAL EXAM:  VITAL SIGNS: ED Triage Vitals  Enc Vitals Group     BP 02/26/21 2124 (!) 156/96     Pulse Rate 02/26/21 2124 88     Resp 02/26/21 2124 18     Temp 02/26/21 2124 97.9 F (36.6 C)     Temp Source 02/26/21 2124 Oral     SpO2 02/26/21 2124 96 %     Weight 02/26/21 2121 112.5 kg (248 lb)     Height 02/26/21 2121 1.676 m (5\' 6" )     Head Circumference --      Peak Flow --      Pain Score 02/26/21 2120 5     Pain Loc --      Pain Edu? --      Excl. in [place]? --     Constitutional: Alert and oriented.  Eyes: Conjunctivae are normal.  Head: Atraumatic. Nose: No congestion/rhinnorhea. Mouth/Throat: Patient is wearing a mask. Neck: No stridor.  No meningeal signs.   Cardiovascular: Normal rate, regular rhythm. Good peripheral circulation. Respiratory: Normal respiratory effort.  No retractions. Gastrointestinal: Soft and nontender. No distention.  Musculoskeletal: No lower extremity tenderness nor edema. No gross deformities of extremities. Neurologic:  Normal speech and language. No gross focal neurologic deficits are appreciated.  Skin:  Skin is warm, dry and intact. Psychiatric: Mood and affect are normal. Speech and behavior are normal.  ____________________________________________   LABS (all labs ordered are listed, but only abnormal results are displayed)  Labs Reviewed  CBC WITH DIFFERENTIAL/PLATELET - Abnormal; Notable for the following components:      Result Value   WBC 17.4 (*)    RBC 5.30 (*)    Hemoglobin 15.8 (*)    HCT 47.6 (*)    Platelets 422 (*)    Neutro Abs 13.2 (*)    All other components  within normal limits  COMPREHENSIVE METABOLIC PANEL - Abnormal; Notable for the following components:   Potassium 3.4 (*)    Glucose, Bld 137 (*)    All other components within normal limits  URINALYSIS, ROUTINE W REFLEX MICROSCOPIC - Abnormal; Notable for the following components:   Hgb urine dipstick MODERATE (*)    Ketones, ur TRACE (*)    Protein, ur 30 (*)    All other components within normal limits  URINALYSIS, MICROSCOPIC (REFLEX) - Abnormal; Notable for the following components:   Bacteria, UA FEW (*)    All other components within normal limits  URINE CULTURE  LIPASE, BLOOD   ____________________________________________   RADIOLOGY Ursula Alert, personally viewed and  evaluated these images (plain radiographs) as part of my medical decision making, as well as reviewing the written report by the radiologist.  ED MD interpretation:  Moderate stool burden, otherwise unremarkable.  Official radiology report(s): CT Renal Stone Study  Result Date: 02/27/2021 CLINICAL DATA:  Flank pain.  Concern for kidney stone. EXAM: CT ABDOMEN AND PELVIS WITHOUT CONTRAST TECHNIQUE: Multidetector CT imaging of the abdomen and pelvis was performed following the standard protocol without IV contrast. COMPARISON:  CT abdomen pelvis dated 01/06/2009. FINDINGS: Evaluation of this exam is limited in the absence of intravenous contrast. Lower chest: The visualized lung bases are clear. No intra-abdominal free air.  Small free fluid in the pelvis. Hepatobiliary: No focal liver abnormality is seen. No gallstones, gallbladder wall thickening, or biliary dilatation. Pancreas: Unremarkable. No pancreatic ductal dilatation or surrounding inflammatory changes. Spleen: Normal in size without focal abnormality. Adrenals/Urinary Tract: The adrenal glands unremarkable. The kidneys, visualized ureters, and urinary bladder appear unremarkable. Stomach/Bowel: No bowel obstruction or active inflammation. Moderate stool  throughout the colon. The appendix is normal. Vascular/Lymphatic: The abdominal aorta and IVC are unremarkable. No portal venous gas. There is no adenopathy. Reproductive: Hysterectomy. Bilateral ovarian follicles/cysts measure up to 3 cm on the right. No follow-up imaging recommended. Note: This recommendation does not apply to premenarchal patients and to those with increased risk (genetic, family history, elevated tumor markers or other high-risk factors) of ovarian cancer. Reference: JACR 2020 Feb; 17(2):248-254 Other: None Musculoskeletal: Degenerative changes of the lower lumbar spine. No acute osseous pathology. IMPRESSION: 1. No acute intra-abdominal or pelvic pathology. No hydronephrosis or nephrolithiasis. 2. Moderate colonic stool burden. No bowel obstruction. Normal appendix. Electronically Signed   By: Anner Crete M.D.   On: 02/27/2021 00:49    ____________________________________________   PROCEDURES   Procedure(s) performed (including Critical Care):  Procedures   ____________________________________________   INITIAL IMPRESSION / MDM / [place] / ED COURSE  As part of my medical decision making, I reviewed the following data within the [place] notes reviewed and incorporated, Labs reviewed , Old chart reviewed, and Notes from prior ED visits   Differential diagnosis includes, but is not limited to, UTI/pyelo, constipation, diverticulitis, renal colic, appendicitis.  Ovarian pathology unlikely given the location of the pain and the fact that it is bilateral.  Vitals within normal limits throughout lengthy ED wait.  Urinalysis borderline, but given presence of dysuria, will treat empirically.  Patient also has leukocytosis of 17.4.  However CT is reassuring without specific abnormality other than stool burden.  Suspect stool burden/constipation is the main cause of her waxing/waning abdominal pain.  CMP and lipase within normal limits.   Benign abdominal exam with no tenderness to palpation.  Had usual/customary constipation discussion with patient.  Treating empirically with Keflex prescription for UTI but urine culture in process as well.  Gave typical return precautions. Patient understands and agrees with plan.           ____________________________________________  FINAL CLINICAL IMPRESSION(S) / ED DIAGNOSES  Final diagnoses:  Generalized abdominal pain  Constipation, unspecified constipation type  Dysuria     MEDICATIONS GIVEN DURING THIS VISIT:  Medications - No data to display   ED Discharge Orders          Ordered    cephALEXin (KEFLEX) 500 MG capsule  2 times daily        02/27/21 0701             Note:  This  document was prepared using Systems analyst and may include unintentional dictation errors.   Hinda Kehr, MD 02/27/21 [phone]

## 2021-02-28 LAB — URINE CULTURE: Culture: 20000 — AB

## 2021-03-02 ENCOUNTER — Other Ambulatory Visit: Payer: Self-pay | Admitting: Emergency Medicine

## 2021-03-02 ENCOUNTER — Emergency Department: Payer: BC Managed Care – PPO

## 2021-03-02 ENCOUNTER — Emergency Department
Admission: RE | Admit: 2021-03-02 | Discharge: 2021-03-02 | Disposition: A | Discharge status: Home / Self Care | Payer: BC Managed Care – PPO | Source: Ambulatory Visit | Attending: Emergency Medicine | Admitting: Emergency Medicine

## 2021-03-02 ENCOUNTER — Inpatient Hospital Stay
Admission: EM | Admit: 2021-03-02 | Discharge: 2021-03-04 | DRG: 759 | Disposition: A | Discharge status: Home / Self Care | Payer: BC Managed Care – PPO | Attending: Obstetrics and Gynecology | Admitting: Obstetrics and Gynecology

## 2021-03-02 ENCOUNTER — Other Ambulatory Visit: Payer: Self-pay

## 2021-03-02 DIAGNOSIS — R102 Pelvic and perineal pain: Secondary | ICD-10-CM | POA: Diagnosis present

## 2021-03-02 DIAGNOSIS — K659 Peritonitis, unspecified: Secondary | ICD-10-CM

## 2021-03-02 DIAGNOSIS — N7093 Salpingitis and oophoritis, unspecified: Principal | ICD-10-CM

## 2021-03-02 DIAGNOSIS — R935 Abnormal findings on diagnostic imaging of other abdominal regions, including retroperitoneum: Secondary | ICD-10-CM

## 2021-03-02 DIAGNOSIS — F17210 Nicotine dependence, cigarettes, uncomplicated: Secondary | ICD-10-CM | POA: Diagnosis present

## 2021-03-02 DIAGNOSIS — Z20822 Contact with and (suspected) exposure to covid-19: Secondary | ICD-10-CM | POA: Diagnosis present

## 2021-03-02 DIAGNOSIS — R1032 Left lower quadrant pain: Secondary | ICD-10-CM

## 2021-03-02 LAB — CBC WITH DIFFERENTIAL/PLATELET
Abs Immature Granulocytes: 0.1 10*3/uL — ABNORMAL HIGH (ref 0.00–0.07)
Basophils Absolute: 0.1 10*3/uL (ref 0.0–0.1)
Basophils Relative: 0 %
Eosinophils Absolute: 0.1 10*3/uL (ref 0.0–0.5)
Eosinophils Relative: 1 %
HCT: 45.2 % (ref 36.0–46.0)
Hemoglobin: 15 g/dL (ref 12.0–15.0)
Immature Granulocytes: 1 %
Lymphocytes Relative: 18 %
Lymphs Abs: 3.5 10*3/uL (ref 0.7–4.0)
MCH: 30.2 pg (ref 26.0–34.0)
MCHC: 33.2 g/dL (ref 30.0–36.0)
MCV: 90.9 fL (ref 80.0–100.0)
Monocytes Absolute: 0.9 10*3/uL (ref 0.1–1.0)
Monocytes Relative: 5 %
Neutro Abs: 14.5 10*3/uL — ABNORMAL HIGH (ref 1.7–7.7)
Neutrophils Relative %: 75 %
Platelets: 399 10*3/uL (ref 150–400)
RBC: 4.97 MIL/uL (ref 3.87–5.11)
RDW: 13.4 % (ref 11.5–15.5)
WBC: 19.3 10*3/uL — ABNORMAL HIGH (ref 4.0–10.5)
nRBC: 0 % (ref 0.0–0.2)

## 2021-03-02 LAB — RESP PANEL BY RT-PCR (FLU A&B, COVID) ARPGX2
Influenza A by PCR: NEGATIVE
Influenza B by PCR: NEGATIVE
SARS Coronavirus 2 by RT PCR: NEGATIVE

## 2021-03-02 LAB — COMPREHENSIVE METABOLIC PANEL
ALT: 15 U/L (ref 0–44)
AST: 12 U/L — ABNORMAL LOW (ref 15–41)
Albumin: 3.6 g/dL (ref 3.5–5.0)
Alkaline Phosphatase: 69 U/L (ref 38–126)
Anion gap: 10 (ref 5–15)
BUN: 17 mg/dL (ref 6–20)
CO2: 27 mmol/L (ref 22–32)
Calcium: 9.1 mg/dL (ref 8.9–10.3)
Chloride: 98 mmol/L (ref 98–111)
Creatinine, Ser: 0.94 mg/dL (ref 0.44–1.00)
GFR, Estimated: >60 mL/min (ref >60)
Glucose, Bld: 97 mg/dL (ref 70–99)
Potassium: 3.5 mmol/L (ref 3.5–5.1)
Sodium: 135 mmol/L (ref 135–145)
Total Bilirubin: 1 mg/dL (ref 0.3–1.2)
Total Protein: 7.9 g/dL (ref 6.5–8.1)

## 2021-03-02 LAB — URINALYSIS, ROUTINE W REFLEX MICROSCOPIC
Bilirubin Urine: NEGATIVE
Glucose, UA: NEGATIVE mg/dL
Ketones, ur: 5 mg/dL — AB
Leukocytes,Ua: NEGATIVE
Nitrite: NEGATIVE
Protein, ur: 30 mg/dL — AB
Specific Gravity, Urine: 1.035 — ABNORMAL HIGH (ref 1.005–1.030)
pH: 5 (ref 5.0–8.0)

## 2021-03-02 LAB — WET PREP, GENITAL
Clue Cells Wet Prep HPF POC: NONE SEEN
Sperm: NONE SEEN
Trich, Wet Prep: NONE SEEN
WBC, Wet Prep HPF POC: <10 — AB (ref <10)
Yeast Wet Prep HPF POC: NONE SEEN

## 2021-03-02 LAB — CHLAMYDIA/NGC RT PCR (ARMC ONLY)
Chlamydia Tr: NOT DETECTED
N gonorrhoeae: NOT DETECTED

## 2021-03-02 MED ORDER — DEXTROSE IN LACTATED RINGERS 5 % IV SOLN: INTRAVENOUS | Status: DC

## 2021-03-02 MED ORDER — IOHEXOL 300 MG/ML  SOLN: 75.0000 mL | Freq: Once | INTRAMUSCULAR | Status: DC | PRN

## 2021-03-02 MED ORDER — HYDROCODONE-ACETAMINOPHEN 5-325 MG PO TABS
1.0000 | ORAL_TABLET | ORAL | Status: DC | PRN
  Administered 2021-03-02 – 2021-03-03 (×4): 2 via ORAL
  Filled 2021-03-02 (×4): qty 2

## 2021-03-02 MED ORDER — ONDANSETRON HCL 4 MG/2ML IJ SOLN
4.0000 mg | Freq: Once | INTRAMUSCULAR | Status: AC
  Administered 2021-03-02: 15:00:00 4 mg via INTRAVENOUS
  Filled 2021-03-02: qty 2

## 2021-03-02 MED ORDER — SODIUM CHLORIDE 0.9 % IV SOLN: INTRAVENOUS | Status: DC | PRN

## 2021-03-02 MED ORDER — METRONIDAZOLE 500 MG/100ML IV SOLN
500.0000 mg | Freq: Two times a day (BID) | INTRAVENOUS | Status: DC
  Administered 2021-03-02 – 2021-03-03 (×2): 500 mg via INTRAVENOUS
  Filled 2021-03-02 (×4): qty 100

## 2021-03-02 MED ORDER — ACETAMINOPHEN 325 MG PO TABS
650.0000 mg | ORAL_TABLET | ORAL | Status: DC | PRN
  Administered 2021-03-04: 650 mg via ORAL
  Filled 2021-03-02: qty 2

## 2021-03-02 MED ORDER — SODIUM CHLORIDE 0.9 % IV SOLN
1.0000 g | INTRAVENOUS | Status: DC
  Administered 2021-03-02: 1 g via INTRAVENOUS
  Filled 2021-03-02 (×3): qty 10

## 2021-03-02 MED ORDER — SODIUM CHLORIDE 0.9 % IV BOLUS
1000.0000 mL | Freq: Once | INTRAVENOUS | Status: AC
  Administered 2021-03-02: 18:00:00 1000 mL via INTRAVENOUS

## 2021-03-02 MED ORDER — MORPHINE SULFATE (PF) 4 MG/ML IV SOLN
4.0000 mg | Freq: Once | INTRAVENOUS | Status: AC
  Administered 2021-03-02: 18:00:00 4 mg via INTRAVENOUS
  Filled 2021-03-02: qty 1

## 2021-03-02 MED ORDER — IOHEXOL 300 MG/ML  SOLN
100.0000 mL | Freq: Once | INTRAMUSCULAR | Status: AC | PRN
  Administered 2021-03-02: 13:00:00 100 mL via INTRAVENOUS

## 2021-03-02 MED ORDER — FENTANYL CITRATE PF 50 MCG/ML IJ SOSY
50.0000 ug | PREFILLED_SYRINGE | Freq: Once | INTRAMUSCULAR | Status: AC
  Administered 2021-03-02: 15:00:00 50 ug via INTRAVENOUS
  Filled 2021-03-02: qty 1

## 2021-03-02 MED ORDER — SODIUM CHLORIDE 0.9 % IV SOLN
100.0000 mg | Freq: Two times a day (BID) | INTRAVENOUS | Status: DC
  Administered 2021-03-03 (×2): 100 mg via INTRAVENOUS
  Filled 2021-03-02 (×6): qty 100

## 2021-03-02 NOTE — ED Notes (Signed)
Pt to ED for persistent pelvic pain. Pain started again about 1 week ago, has felt tight pain in lower abdomen on both sides. Pain was 7/10 before IV morphine. Pelvic exam performed and specimens collected by provider. Husband at bedside.

## 2021-03-02 NOTE — ED Provider Notes (Signed)
Emergency Medicine Provider Triage Evaluation Note  NISSI DOFFING , a 50 y.o. female  was evaluated in triage.  Pt complains of left lower quadrant pain. Symptoms started on Monday night. She has taken MiriLax and has had bowel movements without any relief. She saw Dr. Jimmye Norman who sent her for repeat CT abdomen/pelvis. Questionable ovarian torsion.  Review of Systems  Positive: Abdominal pain Negative: Fever  Physical Exam  There were no vitals taken for this visit. Gen:   Awake, no distress   Resp:  Normal effort  MSK:   Moves extremities without difficulty  Other:    Medical Decision Making  Medically screening exam initiated at 2:45 PM.  Appropriate orders placed.  Vee Bahe Castelli was informed that the remainder of the evaluation will be completed by another provider, this initial triage assessment does not replace that evaluation, and the importance of remaining in the ED until their evaluation is complete.  Verbal report from radiology concerning for tubo/ovarian abscess or torsion. Torsion is not as likely. Patient to be moved to next bed.    Victorino Dike, FNP 03/03/21 2139    Harvest Dark, MD 03/05/21 1221

## 2021-03-02 NOTE — ED Provider Notes (Signed)
[facility]  ____________________________________________   Event Date/Time   First MD Initiated Contact with Patient 03/02/21 1743     (approximate)  I have reviewed the triage vital signs and the nursing notes.   HISTORY  Chief Complaint Abdominal Pain and Emesis    HPI Crystal Leonard is a 50 y.o. female with past medical history of depression, headaches, hypertension, kidney stones who presents with abdominal pain.  Patient was seen in the ED 3 days ago and had a CT without contrast of the abdomen which did not have any acute findings but did show constipation.  She is been taking MiraLAX with no relief.  Followed up with her primary care provider ordered a CT abdomen pelvis with contrast today which was concerning for pyosalpinx and TOA, though she was told to come to emergency department.  She denies fevers or chills.  Denies abnormal vaginal discharge.  Has had dysuria.  No history of similar.  She is in a monogamous relationship with her husband.         Past Medical History:  Diagnosis Date   Chicken pox    Depression    Headache    Frequent headaches   Hypertension    Kidney stones    Migraine    UTI (lower urinary tract infection)     Patient Active Problem List   Diagnosis Date Noted   Erythrocytosis 01/24/2021   Depression, major, single episode, complete remission ([place]) 12/05/2020   OSA (obstructive sleep apnea) 01/25/2020   Annual physical exam 01/25/2020   History of COVID-19 01/25/2020   Bacterial sinusitis 09/15/2019   Skin cancer 05/09/2017   Prediabetes 05/09/2017   Hyperlipidemia 05/09/2017   Chronic right-sided low back pain with right-sided sciatica 05/09/2017   Kidney stone 05/09/2017   Cervicalgia 05/09/2017   Chronic pain of left knee 09/01/2016   Cough present for greater than 3 weeks 08/02/2015   Paresthesia of both hands 07/06/2014   HTN (hypertension) 05/07/2014   Depression 05/07/2014   Migraines  05/07/2014   Tobacco use disorder 05/07/2014    Past Surgical History:  Procedure Laterality Date   ABDOMINAL HYSTERECTOMY  2012   Due to pelvic pain, endometriosis. No GYN cancer. She had one abnormal pap smear in 20's. She has ovaries.NO CERVIX on exam 01/24/21   LAPAROSCOPIC ENDOMETRIOSIS FULGURATION     TONSILLECTOMY AND ADENOIDECTOMY      Prior to Admission medications   Medication Sig Start Date End Date Taking? Authorizing Provider  albuterol (PROVENTIL HFA) 108 (90 Base) MCG/ACT inhaler Inhale 2 puffs into the lungs every 6 (six) hours as needed for wheezing or shortness of breath. 04/09/19   McLean-Scocuzza, Nino Glow, MD  cephALEXin (KEFLEX) 500 MG capsule Take 1 capsule (500 mg total) by mouth 2 (two) times daily. 02/27/21   Hinda Kehr, MD  citalopram (CELEXA) 40 MG tablet Take 40 mg by mouth daily. 11/09/19   [provider]  clonazePAM (KLONOPIN) 0.5 MG tablet Take by mouth daily as needed. 07/20/19   [provider]  DULoxetine (CYMBALTA) 60 MG capsule Take 60 mg by mouth daily.    [provider]  hydrochlorothiazide (HYDRODIURIL) 12.5 MG tablet Take 1 tablet (12.5 mg total) by mouth daily. 12/05/20   Burnard Hawthorne, FNP  Multiple Vitamin (MULTIVITAMIN) tablet Take 1 tablet by mouth daily.    [provider]  omeprazole (PRILOSEC) 20 MG capsule Take 20 mg by mouth as needed.    [provider]  Allergies Tetanus toxoids  Family History  Problem Relation Age of Onset   Hypertension Mother    Cancer Father        prostate   Diabetes Brother    Hypertension Maternal Grandmother    Diabetes Maternal Aunt    Heart attack Neg Hx     Social History Social History   Tobacco Use   Smoking status: Some Days    Packs/day: 1.00    Years: 20.00    Pack years: 20.00    Types: Cigarettes   Smokeless tobacco: Never   Tobacco comments:    0.5 pack per week- 01/25/2020  Vaping Use   Vaping Use: Never used  Substance Use  Topics   Alcohol use: Yes    Alcohol/week: 0.0 standard drinks    Comment: Rare occasions    Drug use: No    Review of Systems   Review of Systems  Constitutional:  Negative for chills and fever.  Respiratory:  Negative for shortness of breath.   Gastrointestinal:  Positive for abdominal pain and nausea. Negative for constipation, diarrhea and vomiting.  Genitourinary:  Positive for dysuria. Negative for vaginal bleeding and vaginal discharge.  All other systems reviewed and are negative.  Physical Exam Updated Vital Signs BP 122/79    Pulse 91    Temp 98.8 F (37.1 C) (Oral)    Resp 16    Ht 5\' 6"  (1.676 m)    Wt 112.5 kg    SpO2 96%    BMI 40.03 kg/m   Physical Exam Vitals and nursing note reviewed.  Constitutional:      General: She is not in acute distress.    Appearance: Normal appearance.  HENT:     Head: Normocephalic and atraumatic.  Eyes:     General: No scleral icterus.    Conjunctiva/sclera: Conjunctivae normal.  Pulmonary:     Effort: Pulmonary effort is normal. No respiratory distress.     Breath sounds: No stridor.  Abdominal:     General: Abdomen is flat.     Comments: Tenderness to palpation in the left lower quadrant and suprapubic region with voluntary guarding Mild tenderness to palpation in the right upper quadrant and left upper quadrant, no guarding  Genitourinary:    Comments: Increased thin white discharge, no CMT No palpable adnexal masses Musculoskeletal:        General: No deformity or signs of injury.     Cervical back: Normal range of motion.  Skin:    General: Skin is dry.     Coloration: Skin is not jaundiced or pale.  Neurological:     General: No focal deficit present.     Mental Status: She is alert and oriented to person, place, and time. Mental status is at baseline.  Psychiatric:        Mood and Affect: Mood normal.        Behavior: Behavior normal.     LABS (all labs ordered are listed, but only abnormal results are  displayed)  Labs Reviewed  COMPREHENSIVE METABOLIC PANEL - Abnormal; Notable for the following components:      Result Value   AST 12 (*)    All other components within normal limits  CBC WITH DIFFERENTIAL/PLATELET - Abnormal; Notable for the following components:   WBC 19.3 (*)    Neutro Abs 14.5 (*)    Abs Immature Granulocytes 0.10 (*)    All other components within normal limits  CHLAMYDIA/NGC RT PCR ([facility] ONLY)  WET PREP, GENITAL  RESP PANEL BY RT-PCR (FLU A&B, COVID) [id number]  URINALYSIS, ROUTINE W REFLEX MICROSCOPIC   ____________________________________________  EKG   ____________________________________________  RADIOLOGY I, Madelin Headings, personally viewed and evaluated these images (plain radiographs) as part of my medical decision making, as well as reviewing the written report by the radiologist.  ED MD interpretation: I reviewed the CT abdomen pelvis which was performed earlier today which shows inflammation in the left adnexa with concern for PID    ____________________________________________   PROCEDURES  Procedure(s) performed (including Critical Care):  Procedures   ____________________________________________   INITIAL IMPRESSION / ASSESSMENT AND PLAN / ED COURSE     50 year old female presenting with left lower quadrant pain and an outpatient CT scan concerning for pyosalpinx/TOA.  Vital signs within normal limits.  Her labs are notable for leukocytosis of 19 but otherwise within normal limits.  On exam she has significant tenderness in the left lower quadrant with guarding, and really no abnormality on pelvic exam other than some increased clear discharge.  Pelvic ultrasound today is concerning again for either TOA with PID versus ovarian torsion as there is no arterial or venous flow.  Clinically I am more concerned for PID with her leukocytosis and timeframe of her symptoms. Discussed with Dr. Amalia Hailey who agrees that torsion is less  likely.  Will admit for IV antibiotics and observation and pain control.  Clinical Course as of 03/02/21 1847  Fri Mar 02, 2021  1822 US PELVIC COMPLETE W TRANSVAGINAL AND TORSION R/O [KM]    Clinical Course User Index [KM] Rada Hay, MD     ____________________________________________   FINAL CLINICAL IMPRESSION(S) / ED DIAGNOSES  Final diagnoses:  Tubo-ovarian abscess     ED Discharge Orders     None        Note:  This document was prepared using Dragon voice recognition software and may include unintentional dictation errors.    Rada Hay, MD 03/02/21 [id number]

## 2021-03-02 NOTE — ED Triage Notes (Signed)
Pt c/o LLQ abdominal pain and nausea x 4 days.  Pain score  10/10.  Pt reports being seen previously for same and was diagnosed w/ constipation.  Pt reports CT scan earlier today and sts "they think I have an ovarian torsion."

## 2021-03-02 NOTE — ED Notes (Signed)
Pt knows need for ua . 

## 2021-03-03 DIAGNOSIS — N7093 Salpingitis and oophoritis, unspecified: Secondary | ICD-10-CM | POA: Diagnosis present

## 2021-03-03 DIAGNOSIS — R102 Pelvic and perineal pain: Secondary | ICD-10-CM

## 2021-03-03 DIAGNOSIS — Z20822 Contact with and (suspected) exposure to covid-19: Secondary | ICD-10-CM | POA: Diagnosis present

## 2021-03-03 DIAGNOSIS — F17210 Nicotine dependence, cigarettes, uncomplicated: Secondary | ICD-10-CM | POA: Diagnosis present

## 2021-03-03 LAB — CBC WITH DIFFERENTIAL/PLATELET
Abs Immature Granulocytes: 0.06 10*3/uL (ref 0.00–0.07)
Basophils Absolute: 0.1 10*3/uL (ref 0.0–0.1)
Basophils Relative: 0 %
Eosinophils Absolute: 0.1 10*3/uL (ref 0.0–0.5)
Eosinophils Relative: 1 %
HCT: 40.8 % (ref 36.0–46.0)
Hemoglobin: 13.6 g/dL (ref 12.0–15.0)
Immature Granulocytes: 0 %
Lymphocytes Relative: 16 %
Lymphs Abs: 2.4 10*3/uL (ref 0.7–4.0)
MCH: 30.3 pg (ref 26.0–34.0)
MCHC: 33.3 g/dL (ref 30.0–36.0)
MCV: 90.9 fL (ref 80.0–100.0)
Monocytes Absolute: 1 10*3/uL (ref 0.1–1.0)
Monocytes Relative: 6 %
Neutro Abs: 11.7 10*3/uL — ABNORMAL HIGH (ref 1.7–7.7)
Neutrophils Relative %: 77 %
Platelets: 350 10*3/uL (ref 150–400)
RBC: 4.49 MIL/uL (ref 3.87–5.11)
RDW: 13.4 % (ref 11.5–15.5)
WBC: 15.4 10*3/uL — ABNORMAL HIGH (ref 4.0–10.5)
nRBC: 0 % (ref 0.0–0.2)

## 2021-03-03 MED ORDER — SODIUM CHLORIDE 0.9 % IV SOLN
1.0000 g | INTRAVENOUS | Status: DC
  Administered 2021-03-03: 1 g via INTRAVENOUS
  Filled 2021-03-03 (×2): qty 10

## 2021-03-03 MED ORDER — METRONIDAZOLE 500 MG/100ML IV SOLN
500.0000 mg | Freq: Two times a day (BID) | INTRAVENOUS | Status: DC
  Administered 2021-03-04: 500 mg via INTRAVENOUS
  Filled 2021-03-03 (×2): qty 100

## 2021-03-03 MED ORDER — SODIUM CHLORIDE 0.9 % IV SOLN
100.0000 mg | Freq: Two times a day (BID) | INTRAVENOUS | Status: DC
  Administered 2021-03-04: 100 mg via INTRAVENOUS
  Filled 2021-03-03 (×2): qty 100

## 2021-03-03 NOTE — H&P (Signed)
ADMIT NOTE  HPI:      [name] is a 50 y.o. No obstetric history on file. who LMP was No LMP recorded. Patient has had a hysterectomy.  Subjective: She presented to [facility] ED on Monday with complaint of pelvic/abdominal pain and nausea.  A CT was performed of the abdomen and no abnormalities were noted.  At that time she was noted to have a white cell count of 17. She again presented to the emergency department yesterday with the same complaints although her abdominal/pelvic pain was worsening.  An ultrasound performed showed a tubular structure in the left adnexa.  After staying overnight last night and receiving intravenous antibiotics patient states that her pain has improved.  She continues to have pain but it is not as severe.  She is able to take p.o.  Although she does have some nausea.  Pain medication is controlling her pain.  During the entire course as noted above the patient states that she has been afebrile.          HISTORY Allergies  Allergen Reactions   Tetanus Toxoids Nausea And Vomiting and Other (See Comments)    Syncope     OB History  OB History  No obstetric history on file.    Past Medical History  Past Medical History:  Diagnosis Date   Chicken pox    Depression    Headache    Frequent headaches   Hypertension    Kidney stones    Migraine    UTI (lower urinary tract infection)     Past Surgical History  Past Surgical History:  Procedure Laterality Date   ABDOMINAL HYSTERECTOMY  2012   Due to pelvic pain, endometriosis. No GYN cancer. She had one abnormal pap smear in 20's. She has ovaries.NO CERVIX on exam 01/24/21   LAPAROSCOPIC ENDOMETRIOSIS FULGURATION     TONSILLECTOMY AND ADENOIDECTOMY        Past Social History:  Social History   Socioeconomic History   Marital status: Married    Spouse name: Not on file   Number of children: Not on file   Years of education: Not on file   Highest education level: Not on file   Occupational History   Not on file  Tobacco Use   Smoking status: Some Days    Packs/day: 1.00    Years: 20.00    Pack years: 20.00    Types: Cigarettes   Smokeless tobacco: Never   Tobacco comments:    0.5 pack per week- 01/25/2020  Vaping Use   Vaping Use: Never used  Substance and Sexual Activity   Alcohol use: Yes    Alcohol/week: 0.0 standard drinks    Comment: Rare occasions    Drug use: No   Sexual activity: Yes    Partners: Male    Comment: Husband   Other Topics Concern   Not on file  Social History Narrative   Lives with husband    Pets 4 dogs- Inside/outside dogs    No children   Jobs is a Herbalist and pet sitting    Enjoys hanging with friends and traveling    Social Determinants of Radio broadcast assistant Strain: Not on file  Food Insecurity: Not on file  Transportation Needs: Not on file  Physical Activity: Not on file  Stress: Not on file  Social Connections: Not on file    Family History  Family History  Problem Relation  Age of Onset   Hypertension Mother    Cancer Father        prostate   Diabetes Brother    Hypertension Maternal Grandmother    Diabetes Maternal Aunt    Heart attack Neg Hx      ROS: Constitutional: Denied constitutional symptoms, night sweats, recent illness, fatigue, fever, insomnia and weight loss.  Eyes: Denied eye symptoms, eye pain, photophobia, vision change and visual disturbance.  Ears/Nose/Throat/Neck: Denied ear, nose, throat or neck symptoms, hearing loss, nasal discharge, sinus congestion and sore throat.  Cardiovascular: Denied cardiovascular symptoms, arrhythmia, chest pain/pressure, edema, exercise intolerance, orthopnea and palpitations.  Respiratory: Denied pulmonary symptoms, asthma, pleuritic pain, productive sputum, cough, dyspnea and wheezing.  Gastrointestinal: Denied, gastro-esophageal reflux, melena, nausea and vomiting.  Genitourinary: Denied genitourinary symptoms including symptomatic  vaginal discharge, pelvic relaxation issues, and urinary complaints.  Musculoskeletal: Denied musculoskeletal symptoms, stiffness, swelling, muscle weakness and myalgia.  Dermatologic: Denied dermatology symptoms, rash and scar.  Neurologic: Denied neurology symptoms, dizziness, headache, neck pain and syncope.  Psychiatric: Denied psychiatric symptoms, anxiety and depression.  Endocrine: Denied endocrine symptoms including hot flashes and night sweats.   Medications    Current Discharge Medication List     CONTINUE these medications which have NOT CHANGED   Details  albuterol (PROVENTIL HFA) 108 (90 Base) MCG/ACT inhaler Inhale 2 puffs into the lungs every 6 (six) hours as needed for wheezing or shortness of breath. Qty: 18 g, Refills: 2   Associated Diagnoses: Cough; Bronchospasm    cephALEXin (KEFLEX) 500 MG capsule Take 1 capsule (500 mg total) by mouth 2 (two) times daily. Qty: 14 capsule, Refills: 0    citalopram (CELEXA) 40 MG tablet Take 40 mg by mouth daily.    clonazePAM (KLONOPIN) 0.5 MG tablet Take by mouth daily as needed.    DULoxetine (CYMBALTA) 60 MG capsule Take 60 mg by mouth daily.    hydrochlorothiazide (HYDRODIURIL) 12.5 MG tablet Take 1 tablet (12.5 mg total) by mouth daily. Qty: 90 tablet, Refills: 1   Associated Diagnoses: Essential hypertension    Multiple Vitamin (MULTIVITAMIN) tablet Take 1 tablet by mouth daily.    omeprazole (PRILOSEC) 20 MG capsule Take 20 mg by mouth as needed.       @ENCMED @   Objective: Vitals:   03/03/21 0352 03/03/21 0837  BP: 104/65 104/63  Pulse: 82 85  Resp: 18 18  Temp: 98.2 F (36.8 C) 98.6 F (37 C)  SpO2: 96% 96%      HEENT: Grossly within normal limits.  Normo-cephalic.  Neck Supple.  Pupils reactive.  Thyroid Smooth without nodularity or enlargement.  Skin No rashes, lesions or ulceration  Breasts: No masses or discharge.  Symmetric.  No axillary adenopathy.  Lungs: Clear to auscultation.  No rales or  wheezes.  Heart: NSR.  No murmurs or rubs appreciated.  Abdomen: Soft.  Moderately tender to deep palpation.  No rebound.  Moderate guarding in the left lower quadrant.  Extremities: Moves all appropriately.  Normal ROM for age.  Neuro: Oriented to PPT.  Normal mood.           CT : Reproductive: Status post hysterectomy. 3.2 cm right ovarian cyst is noted. There is noted the interval development of severe inflammation involving the left adnexal region and ovary. Serpiginous tubular structure is noted concerning for hydrosalpinx or pyosalpinx.        [place]: IMPRESSION: Complex tubular structure seen with the left adnexa with significant adjacent inflammatory change. Unclear which portions  represent fallopian tube versus ovarian tissue. Patient is status post hysterectomy. No demonstrable arterial or venous flow to the left-sided adnexal structures. Findings are concerning for either pelvic inflammatory disease with tubo-ovarian abscess or ovarian torsion.   Other: No abdominal wall hernia or abnormality. No abdominopelvic ascites.   ASSESSMENT:  1.  TOA -likely not STD related. (Cultures negative with prior hysterectomy)   After 12 hours of IV antibiotics a decrease in white count was noted and the patient's symptoms have lessened. 2.  Doubt torsion.  PLAN: 1.  Continue intravenous antibiotics.  Follow white count and pain.  Probable discharge tomorrow if patient continues to improve.  Approximately 2 weeks of oral antibiotics at discharge.  I spent 42 minutes involved in the care of this patient preparing to see the patient by obtaining and reviewing her medical history (including labs, imaging tests and prior procedures), documenting clinical information in the electronic health record (EHR), counseling and coordinating care plans, writing and sending prescriptions, ordering tests or procedures and in direct communicating with the patient and medical staff discussing pertinent items  from her history and physical exam.   Jeannie Fend ,MD 03/03/2021,11:25 AM

## 2021-03-03 NOTE — Progress Notes (Signed)
Pt sitting in chair able to tolerate PO food and fluids without nausea. Pain after norco is controlled at 2/10 in the LLQ.

## 2021-03-03 NOTE — Progress Notes (Signed)
RN called to pt room. Pt was awakened in her sleep with pain 6/10 in LLQ throbbing. Pt also noting nausea and has associated having nausea when her pain gets worse. Pt declined anything for nausea. Pt still able to tolerate PO fluids.

## 2021-03-04 DIAGNOSIS — N7093 Salpingitis and oophoritis, unspecified: Secondary | ICD-10-CM | POA: Diagnosis not present

## 2021-03-04 LAB — CBC WITH DIFFERENTIAL/PLATELET
Abs Immature Granulocytes: 0.07 10*3/uL (ref 0.00–0.07)
Basophils Absolute: 0.1 10*3/uL (ref 0.0–0.1)
Basophils Relative: 0 %
Eosinophils Absolute: 0.1 10*3/uL (ref 0.0–0.5)
Eosinophils Relative: 1 %
HCT: 39.2 % (ref 36.0–46.0)
Hemoglobin: 13.1 g/dL (ref 12.0–15.0)
Immature Granulocytes: 1 %
Lymphocytes Relative: 15 %
Lymphs Abs: 2.4 10*3/uL (ref 0.7–4.0)
MCH: 30.5 pg (ref 26.0–34.0)
MCHC: 33.4 g/dL (ref 30.0–36.0)
MCV: 91.2 fL (ref 80.0–100.0)
Monocytes Absolute: 0.8 10*3/uL (ref 0.1–1.0)
Monocytes Relative: 5 %
Neutro Abs: 12.1 10*3/uL — ABNORMAL HIGH (ref 1.7–7.7)
Neutrophils Relative %: 78 %
Platelets: 380 10*3/uL (ref 150–400)
RBC: 4.3 MIL/uL (ref 3.87–5.11)
RDW: 13.2 % (ref 11.5–15.5)
WBC: 15.5 10*3/uL — ABNORMAL HIGH (ref 4.0–10.5)
nRBC: 0 % (ref 0.0–0.2)

## 2021-03-04 MED ORDER — LEVOFLOXACIN 500 MG PO TABS: 500.0000 mg | ORAL_TABLET | Freq: Every day | ORAL | 0 refills | Status: AC

## 2021-03-04 MED ORDER — METRONIDAZOLE 500 MG PO TABS: 500.0000 mg | ORAL_TABLET | Freq: Two times a day (BID) | ORAL | 0 refills | Status: AC

## 2021-03-04 NOTE — Progress Notes (Signed)
Discharge instructions, prescriptions, education, and appointments given and explained. Pt verbalized understanding with no further questions. Pt denies need for wheelchair and ambulated with s/o to personal vehicle for d/c.

## 2021-03-04 NOTE — Discharge Summary (Addendum)
Discharge Summary  Admit date: 03/02/2021  Discharge Date and Time:03/04/2021  10:06 AM  Discharge to:  Home  Admission Diagnosis:  TOA                    Discharge  Diagnoses: Principal Problem:   TOA (tubo-ovarian abscess)   OR Procedures:                                Discharge Day Progress Note:   Subjective:   The patient does not have complaints.  She is ambulating well. She is taking PO well. Her pain is well controlled with her current medications and is much improved from yesterday.  She is no longer requiring narcotics for pain relief.  She is urinating without difficulty and is passing flatus.  She desires discharge today.   Objective:  BP 116/83 (BP Location: Right Arm)    Pulse 80    Temp 97.9 F (36.6 C) (Oral)    Resp 20    Ht 5\' 6"  (1.676 m)    Wt 112.5 kg    SpO2 96%    BMI 40.03 kg/m     Abdomen:                           Soft-minimal tenderness    Assessment:   TOA -WBCs decreased during hospital stay, patient no longer having significant abdominal pain, no further nausea and vomiting    Plan:        Discharge home.                       Antibiotics to complete 2-week course  Hospital Course:  No notes on file   Condition at Discharge:  good Discharge Medications:  Allergies as of 03/04/2021       Reactions   Tetanus Toxoids Nausea And Vomiting, Other (See Comments)   Syncope        Medication List     STOP taking these medications    cephALEXin 500 MG capsule Commonly known as: KEFLEX       TAKE these medications    albuterol 108 (90 Base) MCG/ACT inhaler Commonly known as: Proventil HFA Inhale 2 puffs into the lungs every 6 (six) hours as needed for wheezing or shortness of breath.   citalopram 40 MG tablet Commonly known as: CELEXA Take 40 mg by mouth daily.   clonazePAM 0.5 MG tablet Commonly known as: KLONOPIN Take by mouth daily as needed.   DULoxetine 60 MG capsule Commonly known as: CYMBALTA Take 60 mg by mouth  daily.   hydrochlorothiazide 12.5 MG tablet Commonly known as: HYDRODIURIL Take 1 tablet (12.5 mg total) by mouth daily.   levofloxacin 500 MG tablet Commonly known as: Levaquin Take 1 tablet (500 mg total) by mouth daily for 14 days.   metroNIDAZOLE 500 MG tablet Commonly known as: FLAGYL Take 1 tablet (500 mg total) by mouth 2 (two) times daily for 14 days.   multivitamin tablet Take 1 tablet by mouth daily.   omeprazole 20 MG capsule Commonly known as: PRILOSEC Take 20 mg by mouth as needed.               Discharge Care Instructions  (From admission, onward)           Start     Ordered   03/04/21 0000  No dressing needed       Comments: Keep wound area clean and dry as directed   03/04/21 1005             Follow Up:    Follow-up Information     Harlin Heys, MD Follow up in 2 week(s).   Specialties: Obstetrics and Gynecology, Radiology Why: May do Video visit if desired Contact information: [address] [address] [phone]                I spent 32 minutes involved in the care of this patient preparing to see the patient by obtaining and reviewing her medical history (including labs, imaging tests and prior procedures), documenting clinical information in the electronic health record (EHR), counseling and coordinating care plans, writing and sending prescriptions, ordering tests or procedures and in direct communicating with the patient and medical staff discussing pertinent items from her history and physical exam.   Finis Bud, M.D. 03/04/2021 10:06 AM

## 2021-03-27 ENCOUNTER — Telehealth: Payer: Self-pay

## 2021-03-27 NOTE — Telephone Encounter (Signed)
Transition Care Management Unsuccessful Follow-up Telephone Call  Date of discharge and from where:  03/04/2021  [facility]  Attempts:  1st Attempt  Reason for unsuccessful TCM follow-up call:  No answer/busy Tomasa Rand, RN, BSN, CEN [place] Coordinator [phone]

## 2021-03-28 ENCOUNTER — Telehealth: Payer: Self-pay | Admitting: *Deleted

## 2021-03-28 NOTE — Telephone Encounter (Signed)
Transition Care Management Follow-up Telephone Call Date of discharge and from where: 03/04/2021 [facility]  How have you been since you were released from the hospital? "doing good, gone back to work" Any questions or concerns? No  Items Reviewed: Did the pt receive and understand the discharge instructions provided? Yes  Medications obtained and verified? Yes  Other? No  Any new allergies since your discharge? No  Dietary orders reviewed? Yes Do you have support at home? Yes   Home Care and Equipment/Supplies: Were home health services ordered? no If so, what is the name of the agency? N/a  Has the agency set up a time to come to the patient's home? not applicable Were any new equipment or medical supplies ordered?  No What is the name of the medical supply agency? N/a Were you able to get the supplies/equipment? not applicable Do you have any questions related to the use of the equipment or supplies? N/a  Functional Questionnaire: (I = Independent and D = Dependent) ADLs: I  Bathing/Dressing- I  Meal Prep- I  Eating- I  Maintaining continence- I  Transferring/Ambulation- I  Managing Meds- I  Follow up appointments reviewed:  PCP Hospital f/u appt confirmed? No  Specialist Hospital f/u appt confirmed? Yes  Scheduled to see Dr. Glennon Mac at [facility] on 04/09/21 Are transportation arrangements needed? No  If their condition worsens, is the pt aware to call PCP or go to the Emergency Dept.? Yes Was the patient provided with contact information for the PCP's office or ED? Yes Was to pt encouraged to call back with questions or concerns? Yes  Jacqlyn Larsen [facility], BSN RN Case Manager [phone]

## 2021-04-13 ENCOUNTER — Other Ambulatory Visit: Payer: Self-pay | Admitting: Obstetrics and Gynecology

## 2021-04-13 DIAGNOSIS — N7093 Salpingitis and oophoritis, unspecified: Secondary | ICD-10-CM | POA: Diagnosis present

## 2021-04-13 NOTE — H&P (Signed)
Preoperative History and Physical  Crystal Leonard is a 51 y.o. female here for surgical management of left pyosalpinx.   No significant preoperative concerns.  History of Present Illness: 51 y.o. G37P0010 female who presents with left sided pain in December. For this she went to the [facility] ER on 12/26. She had a CT scan that showed what appeared to be constipation.  She was diagnosed with a UTI and was prescribed an antibiotic.  She tried an enema and Miralax and this didn't help.  Then the pain moved from the left to the right and her back was hurting.  She had another CT on 03/02/2021 that showed some thickening of her proximal sigmoid colon likely secondary to left adnexal process.  On the right adnexa there was a 3.2 cm. Also noted was the interval development of a severe inflammation involving the left adnexal region and ovary. There was a serpiginous tubular structure is noted that was concerning for hydrosalpinx or pyosalpinx.     She was admitted and given three antibiotics in the hospital. Because the pain had decreased, she was given 2 antibiotics for a total of 2 weeks (flagyl and levofloxaxin).     She had a right pelvic ultrasound on 03/02/2021 that showed a right ovary measuring 4 x 2.2 x 3 cm.   Left adnexa: Complex tubular structure with significant adjacent echogenic fat/inflammatory change. Unclear whether fallopian tube or ovary. No demonstrable blood flow within this structure.   No demonstrable flow on the left, flow on right was noted.     She was tested for lots of STIs.  She was evaluated by Dr. Amalia Hailey from [facility].    She still has some symptoms: She feels like there are some "pangs" and "twitches." Maybe the pain is not as severe as before.  She had discomfort with bowel movements.  Even with working around the house. This causes her to be sore the next day.  Three days ago, she picked up some boxes and she feels like she ripped something. The pain is not so bad that she  requires narcotics.  She is concerned that the pain will come back.   Follow up ultrasound on 04/10/2021: Ultrasound demonstrates the following findings Adnexa: Right ovary with two simple cysts. Left ovary appears normal; there is a serpiginous mass measuring 1.76 x 2.9 x 2.3 cm.  Uterus: surgically absent Additional: arterial and venous blood flow to both ovaries. No free fluid.   Proposed surgery: Robot assisted laparoscopic bilateral salpingectomy  Past Medical History:  Diagnosis Date   Chicken pox    Depression    Headache    Frequent headaches   Hypertension    Kidney stones    Migraine    UTI (lower urinary tract infection)    Past Surgical History:  Procedure Laterality Date   ABDOMINAL HYSTERECTOMY  2012   Due to pelvic pain, endometriosis. No GYN cancer. She had one abnormal pap smear in 20's. She has ovaries.NO CERVIX on exam 01/24/21   LAPAROSCOPIC ENDOMETRIOSIS FULGURATION     TONSILLECTOMY AND ADENOIDECTOMY     OB History  No obstetric history on file.  Patient denies any other pertinent gynecologic issues.   No current facility-administered medications on file prior to encounter.   Current Outpatient Medications on File Prior to Encounter  Medication Sig Dispense Refill   albuterol (PROVENTIL HFA) 108 (90 Base) MCG/ACT inhaler Inhale 2 puffs into the lungs every 6 (six) hours as needed for wheezing or shortness of  breath. 18 g 2   citalopram (CELEXA) 40 MG tablet Take 40 mg by mouth daily.     clonazePAM (KLONOPIN) 0.5 MG tablet Take by mouth daily as needed.     DULoxetine (CYMBALTA) 60 MG capsule Take 60 mg by mouth daily.     hydrochlorothiazide (HYDRODIURIL) 12.5 MG tablet Take 1 tablet (12.5 mg total) by mouth daily. 90 tablet 1   Multiple Vitamin (MULTIVITAMIN) tablet Take 1 tablet by mouth daily.     omeprazole (PRILOSEC) 20 MG capsule Take 20 mg by mouth as needed.     Allergies  Allergen Reactions   Tetanus Toxoids Nausea And Vomiting and Other  (See Comments)    Syncope     Social History:   reports that she has been smoking cigarettes. She has a 20.00 pack-year smoking history. She has never used smokeless tobacco. She reports current alcohol use. She reports that she does not use drugs.  Family History  Problem Relation Age of Onset   Hypertension Mother    Cancer Father        prostate   Diabetes Brother    Hypertension Maternal Grandmother    Diabetes Maternal Aunt    Heart attack Neg Hx     Review of Systems: Noncontributory  PHYSICAL EXAM: There were no vitals taken for this visit. CONSTITUTIONAL: Well-developed, well-nourished female in no acute distress.  HENT:  Normocephalic, atraumatic, External right and left ear normal. Oropharynx is clear and moist EYES: Conjunctivae and EOM are normal. Pupils are equal, round, and reactive to light. No scleral icterus.  NECK: Normal range of motion, supple, no masses SKIN: Skin is warm and dry. No rash noted. Not diaphoretic. No erythema. No pallor. [place]: Alert and oriented to person, place, and time. Normal reflexes, muscle tone coordination. No cranial nerve deficit noted. PSYCHIATRIC: Normal mood and affect. Normal behavior. Normal judgment and thought content. CARDIOVASCULAR: Normal heart rate noted, regular rhythm RESPIRATORY: Effort and breath sounds normal, no problems with respiration noted ABDOMEN: Soft, nontender, nondistended. PELVIC: Deferred MUSCULOSKELETAL: Normal range of motion. No edema and no tenderness. 2+ distal pulses.  Labs: No results found for this or any previous visit (from the past 336 hour(s)).  Imaging Studies: No results found.  Assessment: Patient Active Problem List   Diagnosis Date Noted   Left pyosalpinx 04/13/2021    Plan: Patient will undergo surgical management with the above surgery.   The risks of surgery were discussed in detail with the patient including but not limited to: bleeding which may require transfusion or  reoperation; infection which may require antibiotics; injury to surrounding organs which may involve bowel, bladder, ureters ; need for additional procedures including laparoscopy or laparotomy; thromboembolic phenomenon, surgical site problems and other postoperative/anesthesia complications. Likelihood of success in alleviating the patient's condition was discussed. Routine postoperative instructions will be reviewed with the patient and her family in detail after surgery.  The patient concurred with the proposed plan, giving informed written consent for the surgery.  Patient has been NPO since last night she will remain NPO for procedure.   Preoperative prophylactic antibiotics, as indicated, and SCDs ordered on call to the OR.  Discussed with patient that with her surgical history and recent active infection there may be too much scar tissue to safely remove the fallopian tubes. If so, I would have to abandon surgery without accomplishing the goal. She voiced understanding that I would proceed cautiously and stop surgery, if too risky.    Prentice Docker, MD  04/13/2021 1:38 PM

## 2021-04-26 ENCOUNTER — Other Ambulatory Visit: Payer: Self-pay

## 2021-04-26 ENCOUNTER — Encounter
Admission: RE | Admit: 2021-04-26 | Discharge: 2021-04-26 | Disposition: A | Discharge status: Home / Self Care | Payer: BC Managed Care – PPO | Source: Ambulatory Visit | Attending: Obstetrics and Gynecology | Admitting: Obstetrics and Gynecology

## 2021-04-26 HISTORY — DX: Gastro-esophageal reflux disease without esophagitis: K21.9

## 2021-04-26 HISTORY — DX: Unspecified asthma, uncomplicated: J45.909

## 2021-04-26 HISTORY — DX: Personal history of urinary calculi: Z87.442

## 2021-04-26 NOTE — Patient Instructions (Signed)
Your procedure is scheduled on:  Report to . To find out your arrival time please call [phone] between 1PM - 3PM on .  Remember: Instructions that are not followed completely may result in serious medical risk,  up to and including death, or upon the discretion of your surgeon and anesthesiologist your  surgery may need to be rescheduled.     _X__ 1. Do not eat food after midnight the night before your procedure.                 No chewing gum or hard candies. You may drink clear liquids up to 2 hours                 before you are scheduled to arrive for your surgery- DO not drink clear                 liquids within 2 hours of the start of your surgery.                 Clear Liquids include:  water, apple juice without pulp, clear Gatorade, G2 or                  Gatorade Zero (avoid Red/Purple/Blue), Black Coffee or Tea (Do not add                 anything to coffee or tea). ___x__2.   Complete the "Ensure Clear Pre-surgery Clear Carbohydrate Drink"provided to you, 2 hours before arrival. **If you are diabetic you will be provided with an alternative drink, Gatorade Zero or G2.  __X__2.  On the morning of surgery brush your teeth with toothpaste and water, you                may rinse your mouth with mouthwash if you wish.  Do not swallow any toothpaste of mouthwash.     _X__ 3.  No Alcohol for 24 hours before or after surgery.   _X__ 4.  Do Not Smoke or use e-cigarettes For 24 Hours Prior to Your Surgery.                 Do not use any chewable tobacco products for at least 6 hours prior to                 Surgery.  ___  5.  Do not use any recreational drugs (marijuana, cocaine, heroin, ecstasy,       MDMA or other) For at least one week prior to your surgery.            Combination of these drugs with anesthesia may have life threatening       results.  ____  6.  Bring all medications with you on the day of surgery if instructed.   ___x_  7.  Notify  your doctor if there is any change in your medical condition      (cold, fever, infections).     Do not wear jewelry, make-up, hairpins, clips or nail polish. Do not wear lotions, powders, or perfumes. You may wear deodorant. Do not shave 48 hours prior to surgery.  Do not bring valuables to the hospital.    [facility] is not responsible for any belongings or valuables.  Contacts, dentures or bridgework may not be worn into surgery. Leave your suitcase in the car. After surgery it may be brought to your room. For patients admitted to the hospital, discharge time is determined  by your treatment team.   Patients discharged the day of surgery will not be allowed to drive home.   Make arrangements for someone to be with you for the first 24 hours of your Same Day Discharge.    Please read over the following fact sheets that you were given:       __x__ Take these medicines the morning of surgery with A SIP OF WATER:    1.   2.   3.   4.  5.  6.  ____ Fleet Enema (as directed)   __x__ Use CHG Soap (or wipes) as directed  ____ Use Benzoyl Peroxide Gel as instructed  ____ Use inhalers on the day of surgery  ____ Stop metformin 2 days prior to surgery    ____ Take 1/2 of usual insulin dose the night before surgery. No insulin the morning          of surgery.   ____ Stop Coumadin/Plavix/aspirin on   ___x_ Stop Anti-inflammatories on    ____ Stop supplements until after surgery.    __x__ Bring C-Pap to the hospital.    If you have any questions regarding your pre-procedure instructions,  Please call Pre-admit Testing at [phone]

## 2021-04-27 ENCOUNTER — Other Ambulatory Visit: Payer: BC Managed Care – PPO

## 2021-04-30 ENCOUNTER — Encounter
Admission: RE | Admit: 2021-04-30 | Discharge: 2021-04-30 | Disposition: A | Discharge status: Home / Self Care | Payer: BC Managed Care – PPO | Source: Ambulatory Visit | Attending: Pediatrics | Admitting: Pediatrics

## 2021-04-30 ENCOUNTER — Other Ambulatory Visit: Payer: Self-pay

## 2021-04-30 DIAGNOSIS — I10 Essential (primary) hypertension: Secondary | ICD-10-CM | POA: Diagnosis not present

## 2021-04-30 DIAGNOSIS — N7091 Salpingitis, unspecified: Secondary | ICD-10-CM | POA: Diagnosis present

## 2021-04-30 DIAGNOSIS — N7093 Salpingitis and oophoritis, unspecified: Secondary | ICD-10-CM | POA: Diagnosis not present

## 2021-04-30 DIAGNOSIS — F32A Depression, unspecified: Secondary | ICD-10-CM | POA: Diagnosis not present

## 2021-04-30 DIAGNOSIS — K660 Peritoneal adhesions (postprocedural) (postinfection): Secondary | ICD-10-CM | POA: Diagnosis not present

## 2021-04-30 DIAGNOSIS — K219 Gastro-esophageal reflux disease without esophagitis: Secondary | ICD-10-CM | POA: Diagnosis not present

## 2021-04-30 DIAGNOSIS — Z01812 Encounter for preprocedural laboratory examination: Secondary | ICD-10-CM | POA: Insufficient documentation

## 2021-04-30 DIAGNOSIS — N7092 Oophoritis, unspecified: Secondary | ICD-10-CM | POA: Diagnosis not present

## 2021-04-30 DIAGNOSIS — F17210 Nicotine dependence, cigarettes, uncomplicated: Secondary | ICD-10-CM | POA: Diagnosis not present

## 2021-04-30 DIAGNOSIS — J45909 Unspecified asthma, uncomplicated: Secondary | ICD-10-CM | POA: Diagnosis not present

## 2021-04-30 LAB — TYPE AND SCREEN
ABO/RH(D): A POS
Antibody Screen: NEGATIVE

## 2021-05-02 MED ORDER — CHLORHEXIDINE GLUCONATE 0.12 % MT SOLN: 15.0000 mL | Freq: Once | OROMUCOSAL | Status: AC

## 2021-05-02 MED ORDER — LACTATED RINGERS IV SOLN: INTRAVENOUS | Status: DC

## 2021-05-02 MED ORDER — ORAL CARE MOUTH RINSE: 15.0000 mL | Freq: Once | OROMUCOSAL | Status: AC

## 2021-05-03 ENCOUNTER — Encounter: Payer: Self-pay | Admitting: Obstetrics and Gynecology

## 2021-05-03 ENCOUNTER — Other Ambulatory Visit: Payer: Self-pay

## 2021-05-03 ENCOUNTER — Ambulatory Visit: Payer: BC Managed Care – PPO | Admitting: Urgent Care

## 2021-05-03 ENCOUNTER — Ambulatory Visit
Admission: RE | Admit: 2021-05-03 | Discharge: 2021-05-03 | Disposition: A | Discharge status: Home / Self Care | Payer: BC Managed Care – PPO | Attending: Obstetrics and Gynecology | Admitting: Obstetrics and Gynecology

## 2021-05-03 ENCOUNTER — Encounter
Admission: RE | Disposition: A | Discharge status: Home / Self Care | Payer: Self-pay | Source: Home / Self Care | Attending: Obstetrics and Gynecology

## 2021-05-03 DIAGNOSIS — J45909 Unspecified asthma, uncomplicated: Secondary | ICD-10-CM | POA: Insufficient documentation

## 2021-05-03 DIAGNOSIS — K219 Gastro-esophageal reflux disease without esophagitis: Secondary | ICD-10-CM | POA: Insufficient documentation

## 2021-05-03 DIAGNOSIS — F17210 Nicotine dependence, cigarettes, uncomplicated: Secondary | ICD-10-CM | POA: Insufficient documentation

## 2021-05-03 DIAGNOSIS — K660 Peritoneal adhesions (postprocedural) (postinfection): Secondary | ICD-10-CM | POA: Insufficient documentation

## 2021-05-03 DIAGNOSIS — I10 Essential (primary) hypertension: Secondary | ICD-10-CM | POA: Insufficient documentation

## 2021-05-03 DIAGNOSIS — F32A Depression, unspecified: Secondary | ICD-10-CM | POA: Insufficient documentation

## 2021-05-03 DIAGNOSIS — N7093 Salpingitis and oophoritis, unspecified: Secondary | ICD-10-CM | POA: Diagnosis not present

## 2021-05-03 DIAGNOSIS — N7092 Oophoritis, unspecified: Secondary | ICD-10-CM | POA: Insufficient documentation

## 2021-05-03 HISTORY — PX: ROBOTIC ASSISTED BILATERAL SALPINGO OOPHERECTOMY: SHX6078

## 2021-05-03 LAB — ABO/RH: ABO/RH(D): A POS

## 2021-05-03 SURGERY — SALPINGO-OOPHORECTOMY, BILATERAL, ROBOT-ASSISTED
Anesthesia: General | Laterality: Bilateral

## 2021-05-03 MED ORDER — DROPERIDOL 2.5 MG/ML IJ SOLN
0.6250 mg | Freq: Once | INTRAMUSCULAR | Status: DC | PRN
  Filled 2021-05-03: qty 2

## 2021-05-03 MED ORDER — OXYCODONE HCL 5 MG/5ML PO SOLN: 5.0000 mg | Freq: Once | ORAL | Status: AC | PRN

## 2021-05-03 MED ORDER — KETOROLAC TROMETHAMINE 30 MG/ML IJ SOLN
INTRAMUSCULAR | Status: DC | PRN
  Administered 2021-05-03: 30 mg via INTRAVENOUS

## 2021-05-03 MED ORDER — ROCURONIUM BROMIDE 100 MG/10ML IV SOLN
INTRAVENOUS | Status: DC | PRN
  Administered 2021-05-03: 60 mg via INTRAVENOUS
  Administered 2021-05-03 (×2): 20 mg via INTRAVENOUS

## 2021-05-03 MED ORDER — LIDOCAINE HCL (PF) 2 % IJ SOLN
INTRAMUSCULAR | Status: AC
  Filled 2021-05-03: qty 5

## 2021-05-03 MED ORDER — ACETAMINOPHEN 10 MG/ML IV SOLN: 1000.0000 mg | Freq: Once | INTRAVENOUS | Status: DC | PRN

## 2021-05-03 MED ORDER — OXYCODONE HCL 5 MG PO TABS
ORAL_TABLET | ORAL | Status: AC
  Administered 2021-05-03: 5 mg via ORAL
  Filled 2021-05-03: qty 1

## 2021-05-03 MED ORDER — ROCURONIUM BROMIDE 10 MG/ML (PF) SYRINGE
PREFILLED_SYRINGE | INTRAVENOUS | Status: AC
  Filled 2021-05-03: qty 10

## 2021-05-03 MED ORDER — ONDANSETRON HCL 4 MG/2ML IJ SOLN
INTRAMUSCULAR | Status: AC
  Filled 2021-05-03: qty 2

## 2021-05-03 MED ORDER — BUPIVACAINE HCL (PF) 0.5 % IJ SOLN
INTRAMUSCULAR | Status: AC
  Filled 2021-05-03: qty 30

## 2021-05-03 MED ORDER — FENTANYL CITRATE (PF) 100 MCG/2ML IJ SOLN
25.0000 ug | INTRAMUSCULAR | Status: DC | PRN
  Administered 2021-05-03: 50 ug via INTRAVENOUS

## 2021-05-03 MED ORDER — ACETAMINOPHEN 500 MG PO TABS: 1000.0000 mg | ORAL_TABLET | Freq: Once | ORAL | Status: AC

## 2021-05-03 MED ORDER — DEXMEDETOMIDINE (PRECEDEX) IN NS 20 MCG/5ML (4 MCG/ML) IV SYRINGE
PREFILLED_SYRINGE | INTRAVENOUS | Status: DC | PRN
Start: 2021-05-03 — End: 2021-05-03
  Administered 2021-05-03 (×2): 4 ug via INTRAVENOUS

## 2021-05-03 MED ORDER — OXYCODONE-ACETAMINOPHEN 5-325 MG PO TABS: 1.0000 | ORAL_TABLET | Freq: Four times a day (QID) | ORAL | 0 refills | Status: AC | PRN

## 2021-05-03 MED ORDER — SUGAMMADEX SODIUM 500 MG/5ML IV SOLN
INTRAVENOUS | Status: AC
  Filled 2021-05-03: qty 5

## 2021-05-03 MED ORDER — FENTANYL CITRATE (PF) 100 MCG/2ML IJ SOLN
INTRAMUSCULAR | Status: AC
  Filled 2021-05-03: qty 2

## 2021-05-03 MED ORDER — SUGAMMADEX SODIUM 200 MG/2ML IV SOLN
INTRAVENOUS | Status: DC | PRN
  Administered 2021-05-03: 300 mg via INTRAVENOUS

## 2021-05-03 MED ORDER — MIDAZOLAM HCL 2 MG/2ML IJ SOLN
INTRAMUSCULAR | Status: DC | PRN
  Administered 2021-05-03: 2 mg via INTRAVENOUS

## 2021-05-03 MED ORDER — FENTANYL CITRATE (PF) 100 MCG/2ML IJ SOLN
INTRAMUSCULAR | Status: AC
  Administered 2021-05-03: 50 ug via INTRAVENOUS
  Filled 2021-05-03: qty 2

## 2021-05-03 MED ORDER — DEXAMETHASONE SODIUM PHOSPHATE 10 MG/ML IJ SOLN
INTRAMUSCULAR | Status: DC | PRN
  Administered 2021-05-03: 10 mg via INTRAVENOUS

## 2021-05-03 MED ORDER — KETOROLAC TROMETHAMINE 30 MG/ML IJ SOLN
INTRAMUSCULAR | Status: AC
  Filled 2021-05-03: qty 1

## 2021-05-03 MED ORDER — LIDOCAINE HCL (CARDIAC) PF 100 MG/5ML IV SOSY
PREFILLED_SYRINGE | INTRAVENOUS | Status: DC | PRN
Start: 2021-05-03 — End: 2021-05-03
  Administered 2021-05-03: 100 mg via INTRAVENOUS

## 2021-05-03 MED ORDER — GABAPENTIN 300 MG PO CAPS: 300.0000 mg | ORAL_CAPSULE | Freq: Once | ORAL | Status: AC

## 2021-05-03 MED ORDER — FENTANYL CITRATE (PF) 100 MCG/2ML IJ SOLN
INTRAMUSCULAR | Status: DC | PRN
  Administered 2021-05-03: 100 ug via INTRAVENOUS

## 2021-05-03 MED ORDER — MIDAZOLAM HCL 2 MG/2ML IJ SOLN
INTRAMUSCULAR | Status: AC
  Filled 2021-05-03: qty 2

## 2021-05-03 MED ORDER — OXYCODONE HCL 5 MG PO TABS: 5.0000 mg | ORAL_TABLET | Freq: Once | ORAL | Status: AC | PRN

## 2021-05-03 MED ORDER — BUPIVACAINE HCL 0.5 % IJ SOLN
INTRAMUSCULAR | Status: DC | PRN
  Administered 2021-05-03: 10 mL

## 2021-05-03 MED ORDER — CHLORHEXIDINE GLUCONATE 0.12 % MT SOLN
OROMUCOSAL | Status: AC
  Administered 2021-05-03: 15 mL via OROMUCOSAL
  Filled 2021-05-03: qty 15

## 2021-05-03 MED ORDER — ONDANSETRON 4 MG PO TBDP: 4.0000 mg | ORAL_TABLET | Freq: Four times a day (QID) | ORAL | 0 refills | Status: DC | PRN

## 2021-05-03 MED ORDER — ACETAMINOPHEN 500 MG PO TABS
ORAL_TABLET | ORAL | Status: AC
  Administered 2021-05-03: 1000 mg via ORAL
  Filled 2021-05-03: qty 2

## 2021-05-03 MED ORDER — GABAPENTIN 300 MG PO CAPS
ORAL_CAPSULE | ORAL | Status: AC
  Administered 2021-05-03: 300 mg via ORAL
  Filled 2021-05-03: qty 1

## 2021-05-03 MED ORDER — IBUPROFEN 600 MG PO TABS: 600.0000 mg | ORAL_TABLET | Freq: Four times a day (QID) | ORAL | 0 refills | Status: DC

## 2021-05-03 MED ORDER — ONDANSETRON HCL 4 MG/2ML IJ SOLN
INTRAMUSCULAR | Status: DC | PRN
  Administered 2021-05-03: 4 mg via INTRAVENOUS

## 2021-05-03 MED ORDER — PROPOFOL 10 MG/ML IV BOLUS
INTRAVENOUS | Status: AC
  Filled 2021-05-03: qty 20

## 2021-05-03 MED ORDER — 0.9 % SODIUM CHLORIDE (POUR BTL) OPTIME
TOPICAL | Status: DC | PRN
  Administered 2021-05-03: 500 mL

## 2021-05-03 MED ORDER — DEXAMETHASONE SODIUM PHOSPHATE 10 MG/ML IJ SOLN
INTRAMUSCULAR | Status: AC
  Filled 2021-05-03: qty 1

## 2021-05-03 MED ORDER — PROPOFOL 10 MG/ML IV BOLUS
INTRAVENOUS | Status: DC | PRN
  Administered 2021-05-03: 200 mg via INTRAVENOUS

## 2021-05-03 MED ORDER — DEXMEDETOMIDINE (PRECEDEX) IN NS 20 MCG/5ML (4 MCG/ML) IV SYRINGE
PREFILLED_SYRINGE | INTRAVENOUS | Status: AC
  Filled 2021-05-03: qty 5

## 2021-05-03 SURGICAL SUPPLY — 68 items
ADH SKN CLS APL DERMABOND .7 (GAUZE/BANDAGES/DRESSINGS) ×1
ANCHOR TIS RET SYS 235ML (MISCELLANEOUS) ×1 IMPLANT
APL PRP STRL LF DISP 70% ISPRP (MISCELLANEOUS) ×2
BACTOSHIELD CHG 4% 4OZ (MISCELLANEOUS) ×1
BAG DRN RND TRDRP ANRFLXCHMBR (UROLOGICAL SUPPLIES) ×1
BAG TISS RTRVL C235 10X14 (MISCELLANEOUS) ×1
BAG URINE DRAIN 2000ML AR STRL (UROLOGICAL SUPPLIES) ×3 IMPLANT
BLADE SURG SZ11 CARB STEEL (BLADE) ×3 IMPLANT
CANNULA REDUC XI 12-8 STAPL (CANNULA) ×2
CANNULA REDUCER 12-8 DVNC XI (CANNULA) IMPLANT
CATH FOLEY 2WAY  5CC 16FR (CATHETERS) ×2
CATH FOLEY 2WAY 5CC 16FR (CATHETERS) ×1
CATH URTH 16FR FL 2W BLN LF (CATHETERS) ×2 IMPLANT
CHLORAPREP W/TINT 26 (MISCELLANEOUS) ×4 IMPLANT
COVER MAYO STAND REUSABLE (DRAPES) ×3 IMPLANT
COVER TIP SHEARS 8 DVNC (MISCELLANEOUS) ×2 IMPLANT
COVER TIP SHEARS 8MM DA VINCI (MISCELLANEOUS) ×2
COVER WAND RF STERILE (DRAPES) ×3 IMPLANT
DEFOGGER SCOPE WARMER CLEARIFY (MISCELLANEOUS) ×3 IMPLANT
DERMABOND ADVANCED (GAUZE/BANDAGES/DRESSINGS) ×1
DERMABOND ADVANCED .7 DNX12 (GAUZE/BANDAGES/DRESSINGS) ×2 IMPLANT
DRAPE 3/4 80X56 (DRAPES) ×3 IMPLANT
DRAPE ARM DVNC X/XI (DISPOSABLE) ×6 IMPLANT
DRAPE COLUMN DVNC XI (DISPOSABLE) ×2 IMPLANT
DRAPE DA VINCI XI ARM (DISPOSABLE) ×8
DRAPE DA VINCI XI COLUMN (DISPOSABLE) ×2
DRAPE UNDER BUTTOCK W/FLU (DRAPES) ×3 IMPLANT
ELECT REM PT RETURN 9FT ADLT (ELECTROSURGICAL) ×2
ELECTRODE REM PT RTRN 9FT ADLT (ELECTROSURGICAL) ×2 IMPLANT
FILTER LAP SMOKE EVAC STRL (MISCELLANEOUS) ×3 IMPLANT
GLOVE SURG ENC MOIS LTX SZ7 (GLOVE) ×6 IMPLANT
GLOVE SURG UNDER POLY LF SZ7.5 (GLOVE) ×9 IMPLANT
GOWN STRL REUS W/ TWL LRG LVL3 (GOWN DISPOSABLE) ×6 IMPLANT
GOWN STRL REUS W/TWL LRG LVL3 (GOWN DISPOSABLE) ×6
GRASPER SUT TROCAR 14GX15 (MISCELLANEOUS) ×1 IMPLANT
IRRIGATION STRYKERFLOW (MISCELLANEOUS) IMPLANT
IRRIGATOR STRYKERFLOW (MISCELLANEOUS) ×2
KIT IMAGING PINPOINTPAQ (MISCELLANEOUS) IMPLANT
KIT PINK PAD W/HEAD ARE REST (MISCELLANEOUS) ×2
KIT PINK PAD W/HEAD ARM REST (MISCELLANEOUS) ×2 IMPLANT
LABEL OR SOLS (LABEL) ×3 IMPLANT
MANIFOLD NEPTUNE II (INSTRUMENTS) ×3 IMPLANT
NEEDLE HYPO 22GX1.5 SAFETY (NEEDLE) ×3 IMPLANT
NS IRRIG 500ML POUR BTL (IV SOLUTION) ×1 IMPLANT
OBTURATOR OPTICAL STANDARD 8MM (TROCAR) ×2
OBTURATOR OPTICAL STND 8 DVNC (TROCAR) ×1
OBTURATOR OPTICALSTD 8 DVNC (TROCAR) ×2 IMPLANT
PACK LAP CHOLECYSTECTOMY (MISCELLANEOUS) ×3 IMPLANT
PAD ARMBOARD 7.5X6 YLW CONV (MISCELLANEOUS) ×3 IMPLANT
PAD OB MATERNITY 4.3X12.25 (PERSONAL CARE ITEMS) ×3 IMPLANT
PAD PREP 24X41 OB/GYN DISP (PERSONAL CARE ITEMS) ×3 IMPLANT
SCRUB CHG 4% DYNA-HEX 4OZ (MISCELLANEOUS) ×2 IMPLANT
SEAL CANN UNIV 5-8 DVNC XI (MISCELLANEOUS) ×6 IMPLANT
SEAL XI 5MM-8MM UNIVERSAL (MISCELLANEOUS) ×6
SEALER VESSEL DA VINCI XI (MISCELLANEOUS) ×2
SEALER VESSEL EXT DVNC XI (MISCELLANEOUS) ×2 IMPLANT
SOLUTION ELECTROLUBE (MISCELLANEOUS) ×3 IMPLANT
SPONGE T-LAP 18X18 ~~LOC~~+RFID (SPONGE) ×3 IMPLANT
STAPLER CANNULA SEAL DVNC XI (STAPLE) IMPLANT
STAPLER CANNULA SEAL XI (STAPLE) ×2
SURGILUBE 2OZ TUBE FLIPTOP (MISCELLANEOUS) ×3 IMPLANT
SUT MNCRL 4-0 (SUTURE) ×2
SUT MNCRL 4-0 27XMFL (SUTURE) ×1
SUT VICRYL 0 AB UR-6 (SUTURE) IMPLANT
SUTURE MNCRL 4-0 27XMF (SUTURE) ×2 IMPLANT
SYR 10ML LL (SYRINGE) ×3 IMPLANT
TOWEL OR 17X26 4PK STRL BLUE (TOWEL DISPOSABLE) ×3 IMPLANT
TUBING EVAC SMOKE HEATED PNEUM (TUBING) ×3 IMPLANT

## 2021-05-03 NOTE — Discharge Instructions (Signed)
AMBULATORY SURGERY  ?DISCHARGE INSTRUCTIONS ? ? ?The drugs that you were given will stay in your system until tomorrow so for the next 24 hours you should not: ? ?Drive an automobile ?Make any legal decisions ?Drink any alcoholic beverage ? ? ?You may resume regular meals tomorrow.  Today it is better to start with liquids and gradually work up to solid foods. ? ?You may eat anything you prefer, but it is better to start with liquids, then soup and crackers, and gradually work up to solid foods. ? ? ?Please notify your doctor immediately if you have any unusual bleeding, trouble breathing, redness and pain at the surgery site, drainage, fever, or pain not relieved by medication. ? ? ? ?Additional Instructions: ? ? ? ?Please contact your physician with any problems or Same Day Surgery at [phone], Monday through Friday 6 am to 4 pm, or [facility] number at [phone].  ?

## 2021-05-03 NOTE — Anesthesia Preprocedure Evaluation (Addendum)
Anesthesia Evaluation  ?Patient identified by MRN, date of birth, ID band ?Patient awake ? ? ? ?Reviewed: ?Allergy & Precautions, NPO status , Patient's Chart, lab work & pertinent test results ? ?Airway ?Mallampati: III ? ?TM Distance: >3 FB ?Neck ROM: full ? ? ? Dental ?no notable dental hx. ? ?  ?Pulmonary ?asthma , sleep apnea (mild) , Current Smoker and Patient abstained from smoking.,  ?  ?Pulmonary exam normal ? ? ? ? ? ? ? Cardiovascular ?Exercise Tolerance: Good ?hypertension, Pt. on medications ?Normal cardiovascular exam ? ? ?  ?Neuro/Psych ? Headaches, PSYCHIATRIC DISORDERS Depression   ? GI/Hepatic ?Neg liver ROS, GERD  Medicated and Controlled,  ?Endo/Other  ?Morbid obesity ? Renal/GU ?  ? ?  ?Musculoskeletal ? ? Abdominal ?(+) + obese,   ?Peds ? Hematology ?negative hematology ROS ?(+)   ?Anesthesia Other Findings ?Left pyosalpinx, pelvic inflammatory disease ? ?Past Medical History: ?No date: Asthma ?No date: Chicken pox ?No date: Depression ?No date: GERD (gastroesophageal reflux disease) ?No date: Headache ?    Comment:  Frequent headaches ?No date: History of kidney stones ?No date: Hypertension ?No date: Migraine ?No date: UTI (lower urinary tract infection) ? ?Past Surgical History: ?2012: ABDOMINAL HYSTERECTOMY ?    Comment:  Due to pelvic pain, endometriosis. No GYN cancer. She  ?             had one abnormal pap smear in 20's. She has ovaries.NO  ?             CERVIX on exam 01/24/21 ?No date: LAPAROSCOPIC ENDOMETRIOSIS FULGURATION ?No date: TONSILLECTOMY AND ADENOIDECTOMY ? ? ? ? Reproductive/Obstetrics ?negative OB ROS ? ?  ? ? ? ? ? ? ? ? ? ? ? ? ? ?  ?  ? ? ? ? ? ? ? ?Anesthesia Physical ?Anesthesia Plan ? ?ASA: 2 ? ?Anesthesia Plan: General ETT  ? ?Post-op Pain Management: Toradol IV (intra-op)*, Tylenol PO (pre-op)* and Gabapentin PO (pre-op)*  ? ?Induction: Intravenous ? ?PONV Risk Score and Plan: Ondansetron, Dexamethasone, Midazolam and Treatment may  vary due to age or medical condition ? ?Airway Management Planned: Oral ETT ? ?Additional Equipment:  ? ?Intra-op Plan:  ? ?Post-operative Plan: Extubation in OR ? ?Informed Consent: I have reviewed the patients History and Physical, chart, labs and discussed the procedure including the risks, benefits and alternatives for the proposed anesthesia with the patient or authorized representative who has indicated his/her understanding and acceptance.  ? ? ? ?Dental advisory given ? ?Plan Discussed with: Anesthesiologist, CRNA and Surgeon ? ?Anesthesia Plan Comments:   ? ? ? ? ? ?Anesthesia Quick Evaluation ? ?

## 2021-05-03 NOTE — Transfer of Care (Signed)
Immediate Anesthesia Transfer of Care Note ? ?Patient: Crystal Leonard ? ?Procedure(s) Performed: ROBOTIC ASSISTED LAPAROSCOPIC RIGHT SALPINGECTOMY, LEFT SALPINGO-OOPHERECTOMY, REMOVAL OF PYOSALPINX ON LEFT, LYSIS OF ADHESIONS (Bilateral) ? ?Patient Location: PACU ? ?Anesthesia Type:General ? ?Level of Consciousness: drowsy ? ?Airway & Oxygen Therapy: Patient Spontanous Breathing and Patient connected to face mask oxygen ? ?Post-op Assessment: Report given to RN and Post -op Vital signs reviewed and stable ? ?Post vital signs: Reviewed and stable ? ?Last Vitals:  ?Vitals Value Taken Time  ?BP 126/73 05/03/21 1333  ?Temp 36.7 ?C 05/03/21 1333  ?Pulse 93 05/03/21 1335  ?Resp 16 05/03/21 1333  ?SpO2 97 % 05/03/21 1335  ?Vitals shown include unvalidated device data. ? ?Last Pain:  ?Vitals:  ? 05/03/21 1333  ?TempSrc:   ?PainSc: Asleep  ?   ? ?  ? ?Complications: No notable events documented. ?

## 2021-05-03 NOTE — Interval H&P Note (Signed)
History and Physical Interval Note: ? ?05/03/2021 ?11:11 AM ? ?Crystal Leonard  has presented today for surgery, with the diagnosis of left pyosalpinx, pelvic inflammatory disease.  The various methods of treatment have been discussed with the patient and family. After consideration of risks, benefits and other options for treatment, the patient has consented to  Procedure(s): ?ROBOTIC ASSISTED LAPAROSCOPIC BILATERAL SALPINGECTOMY, REMOVAL OF PYOSALPINX ON LEFT (Bilateral) as a surgical intervention.  The patient's history has been reviewed, patient examined, no change in status, stable for surgery.  I have reviewed the patient's chart and labs.  Questions were answered to the patient's satisfaction.  Consents reviewed. Discussed that there may be too much scar tissue to achieve surgery, but will take every safe precaution to make surgery successful.  ? ?Prentice Docker, MD, FACOG ?[facility] ?05/03/2021 11:12 AM   ? ?

## 2021-05-03 NOTE — Op Note (Signed)
?Operative Note   ? ?Name: Crystal Leonard  ?Date of Service: 05/03/2021  ?DOB: 04-Oct-1970  ?MRN: [id number]  ? 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 ?Procedures:  ?1. Robot assisted laparoscopic right salpingectomy, left salping-oophorectomy ?2. Robot assisted laparoscopic lysis of adhesions ? ?Primary Surgeon: Prentice Docker, MD ?  ?Consulting Surgeon: Caroleen Hamman, MD (see separate note) ? ?EBL: 10 mL  ? ?IVF: 1,000 mL  ? ?Urine output: 175 mL clear urine at end of procedure ? ?Specimens:  ?1) right fallopian tube ?2) left fallopian tube and left ovary ? ?Drains: none ? ?Complications: None  ? ?Disposition: PACU  ? ?Condition: Stable  ? ?Findings:  ?1) normal-appearing right fallopian tube and ovary ?2) sigmoid colon adherent to left pelvic sidewall, ovary, and fallopian tube ?3) left ovary and fallopian tube adherent to left pelvic sidewall ?4) left ovary appears largely necrotic without obvious healthy tissue. ? ?Procedure Summary:  ?The patient was taken to the operating room where general anesthesia was administered and found to be adequate. She was placed in the dorsal supine lithotomy position in [place] stirrups and prepped and draped in usual sterile fashion. After a timeout was called an indwelling catheter was placed in her bladder.   ? ?Attention was turned to the abdomen where after injection of local anesthetic, an 8 mm supraumbilical incision was made with the scalpel. Entry into the abdomen was obtained via Optiview trocar technique (a blunt entry technique with camera visualization through the obturator upon entry). Verification of entry into the abdomen was obtained using opening pressures. The abdomen was insufflated with CO2. The camera was introduced through the trocar with verification of atraumatic entry.   Right and left abdominal entry sites were created after injection of local anesthetic about 8 cm away from the umbilical port in accordance with the Intuitive manufacturer's recommendations.  The port sites were 8 mm.  The intuitive trochars were introduced under intra-abdominal camera visualization without difficulty. ? ?The robot was docked from the patient's left side.  The camera was attached to arm 3.  After targeting was performed, arms 2 and 4 were connected to the bilateral trocars, respectively.  The instruments were placed under direct intra-abdominal camera visualization.  In arm 4 the monopolar laparoscopic scissors were placed without difficulty.  Through arm 2 the vessel sealer was placed without difficulty. ? ?Lysis of adhesions was undertaken along the left pelvic sidewall freeing the bowel to gain visualization to the left ovary and fallopian tube.  The bowel and epiploica were adherent to the ovary and fallopian tube.  After freeing the bowel, the fallopian tube and ovary were gently dissected away from the left pelvic sidewall.  No sharp dissection was needed.  At this point the left ovary did appear quite necrotic and the left fallopian tube appeared necrotic, as well.  There was mild torsion of the left infundibulopelvic ligament.  Given the amount of adhesions taken down, an intraoperative consult with general surgery, Dr. Caroleen Hamman, MD, was requested.  While awaiting Dr. Dahlia Byes the right fallopian tube was grasped and using the vessel sealer along the mesosalpinx the right fallopian tube was liberated.  The fallopian tube was removed intact through the 8 mm trocar. ? ?Dr. Dahlia Byes inspected the colon in the upper pelvis and lower pelvis and created additional space.  See Dr. Corlis Leak note  for more details.  He verified no obvious injury to the bowels. ? ?Initially removal of just the fallopian tube was attempted.  What appeared to be the mesosalpinx was cauterized and transected using the vessel  sealer.  It is notable that this tissue was highly edematous and ischemic appearing.  After inspecting the infundibulopelvic ligament and the ovary, the decision was made to remove the ovary along with the remaining portions of the fallopian tube.  The rationale was that this ovary appeared to be necrotic at this point with a high likelihood of torsion, if not removed and there was viable ovary.  There was no ureter in the area, which was visualized medial to the infundibulopelvic stalk.  Using the vessel sealer the infundibulopelvic ligament was cauterized and transected.  Hemostasis was verified in the operative sites.  The pressure in the abdomen was lowered to 5 mmHg and hemostasis remained.  Any blood was evacuated from the pelvis and irrigation was also performed. ? ?The robot was undocked from the patient.  The 8 mm supraumbilical trocar was removed.  The incision was extended and a 12 mm trocar was placed.  The retrieval bag was introduced through the 12 mm trocar under direct intra-abdominal camera visualization.  The remaining specimens were placed in the retrieval bag (left fallopian tube portion and left ovary with some portion of fallopian tube).  The bag was removed without difficulty through the 12 mm trocar site.  Again hemostasis was verified in the abdomen and pelvis.  The 12 mm trocar was removed.  The fascia was closed using 0 Vicryl in a single stitch using the PMI.  This was done under direct intra-abdominal camera visualization.  All instruments were removed.  CO2 was emptied from the abdomen with the help of 5 deep breaths from anesthesia.  The trocars were removed.  The skin at the incision sites was closed using 4-0 Monocryl in a subcuticular fashion.  Surgical skin glue was placed over the incisions. ? ?The Foley catheter was removed from the bladder.  The vagina was swept to ensure no instruments or sponges remained, even though none were used during the case. ? ?The patient tolerated the  procedure well.  Sponge, lap, needle, and instrument counts were correct x 2.  VTE prophylaxis: SCDs. Antibiotic prophylaxis: none indicated and none given. She was awakened in the operating room and was taken to the PACU in stable condition.  ? ?Prentice Docker, MD ?05/03/2021 1:24 PM  ? ?

## 2021-05-03 NOTE — Op Note (Signed)
PROCEDURES: ?1. Robotic assisted Laparoscopic lysis of adhesions ? ? ?Pre-operative Diagnosis:Pelvic pain  ? ?Post-operative Diagnosis: same ? ?Surgeon: Marjory Lies Treonna Klee  ? ?Anesthesia: General endotracheal anesthesia ? ? ?Surgeon: Caroleen Hamman , MD FACS ? ?Anesthesia: Gen. with endotracheal tube ? ?Findings: ?Sigmoid adhere to pelvic wall ?No evidence of any injuries to the colon or small bowel ? ?Estimated Blood Loss: minimal for my portion ?             ?Complications: none ?        ?Procedure Details  ?Intraoperative consult called by Dr. Glennon Mac.  Briefly Dr. Glennon Mac was doing a laparoscopic bilateral salpingectomy.  He did encounter some adhesions on the pelvis and I was called to assist and assess the adhesions.  The patient had three robotic arms docked. ?I did discuss the case in detail with Dr. Glennon Mac.  He was able to tease down some adhesions. ?Went to the console and started my evaluation.  There was no evidence of any bowel injuries.  There was evidence of the sigmoid adhered to the pelvic wall.  Using scissors were able to perform lysis of adhesions.  I was able to then manipulate the sigmoid colon and make sure that there were no injuries.  I was  able to free out some adhesions for Dr. Glennon Mac ordered for him to proceed with a safe operation. ?Turned the case to Dr. Glennon Mac.  Please see his operative report for further details ? ?Caroleen Hamman, MD, FACS ? ?  ?

## 2021-05-03 NOTE — Anesthesia Procedure Notes (Signed)
Procedure Name: Intubation ?Date/Time: 05/03/2021 11:24 AM ?Performed by: Jerrye Noble, CRNA ?Pre-anesthesia Checklist: Patient identified, Emergency Drugs available, Suction available and Patient being monitored ?Patient Re-evaluated:Patient Re-evaluated prior to induction ?Oxygen Delivery Method: Circle system utilized ?Preoxygenation: Pre-oxygenation with 100% oxygen ?Induction Type: IV induction ?Ventilation: Mask ventilation without difficulty ?Laryngoscope Size: McGraph and 3 ?Grade View: Grade I ?Tube type: Oral ?Tube size: 7.0 mm ?Number of attempts: 1 ?Airway Equipment and Method: Stylet, Oral airway and Video-laryngoscopy ?Placement Confirmation: ETT inserted through vocal cords under direct vision, positive ETCO2 and breath sounds checked- equal and bilateral ?Secured at: 21 cm ?Tube secured with: Tape ?Dental Injury: Teeth and Oropharynx as per pre-operative assessment  ? ? ? ? ?

## 2021-05-04 ENCOUNTER — Encounter: Payer: Self-pay | Admitting: Obstetrics and Gynecology

## 2021-05-04 LAB — SURGICAL PATHOLOGY

## 2021-05-04 NOTE — Anesthesia Postprocedure Evaluation (Signed)
Anesthesia Post Note ? ?Patient: Crystal Leonard ? ?Procedure(s) Performed: ROBOTIC ASSISTED LAPAROSCOPIC RIGHT SALPINGECTOMY, LEFT SALPINGO-OOPHERECTOMY, REMOVAL OF PYOSALPINX ON LEFT, LYSIS OF ADHESIONS (Bilateral) ? ?Patient location during evaluation: PACU ?Anesthesia Type: General ?Level of consciousness: awake and alert ?Pain management: pain level controlled ?Vital Signs Assessment: post-procedure vital signs reviewed and stable ?Respiratory status: spontaneous breathing, nonlabored ventilation and respiratory function stable ?Cardiovascular status: blood pressure returned to baseline and stable ?Postop Assessment: no apparent nausea or vomiting ?Anesthetic complications: no ? ? ?No notable events documented. ? ? ?Last Vitals:  ?Vitals:  ? 05/03/21 1500 05/03/21 1517  ?BP: 116/69 137/80  ?Pulse: (!) 101 95  ?Resp: 18 18  ?Temp: 36.7 ?C 36.6 ?C  ?SpO2: 93% 98%  ?  ?Last Pain:  ?Vitals:  ? 05/03/21 1517  ?TempSrc: Temporal  ?PainSc: 0-No pain  ? ? ?  ?  ?  ?  ?  ?  ? ?[place] Ouch ? ? ? ? ?

## 2021-06-16 ENCOUNTER — Other Ambulatory Visit: Payer: Self-pay | Admitting: Family

## 2021-06-16 DIAGNOSIS — I10 Essential (primary) hypertension: Secondary | ICD-10-CM

## 2021-07-24 ENCOUNTER — Ambulatory Visit: Payer: BC Managed Care – PPO | Admitting: Family

## 2021-12-18 ENCOUNTER — Other Ambulatory Visit: Payer: Self-pay | Admitting: Family

## 2021-12-18 DIAGNOSIS — I10 Essential (primary) hypertension: Secondary | ICD-10-CM

## 2022-06-10 ENCOUNTER — Other Ambulatory Visit: Payer: Self-pay | Admitting: Family

## 2022-06-10 ENCOUNTER — Telehealth: Payer: Self-pay

## 2022-06-10 DIAGNOSIS — I10 Essential (primary) hypertension: Secondary | ICD-10-CM

## 2022-06-10 NOTE — Telephone Encounter (Signed)
LVM to CB to get scheduled to receive further refills, pt was last seen in [facility] 01/24/2021

## 2022-06-10 NOTE — Telephone Encounter (Signed)
Left detailed VM informing pt that she needs to CB to get scheduled to get further refills on her HCTZ, PT was last seen in office 01/2021  When pt calls back place on PCP schedule for further refills

## 2022-06-10 NOTE — Telephone Encounter (Signed)
Pt returned [facility] CMA call. I scheduled pt with Arnett for 5/28 @ 11:30am

## 2022-07-30 ENCOUNTER — Ambulatory Visit: Payer: BC Managed Care – PPO | Admitting: Family

## 2022-07-30 ENCOUNTER — Encounter: Payer: Self-pay | Admitting: Family

## 2022-07-30 VITALS — BP 126/80 | HR 91 | Temp 98.3°F | Ht 66.0 in | Wt 240.1 lb

## 2022-07-30 DIAGNOSIS — I10 Essential (primary) hypertension: Secondary | ICD-10-CM | POA: Diagnosis not present

## 2022-07-30 DIAGNOSIS — R7303 Prediabetes: Secondary | ICD-10-CM | POA: Diagnosis not present

## 2022-07-30 DIAGNOSIS — Z8639 Personal history of other endocrine, nutritional and metabolic disease: Secondary | ICD-10-CM | POA: Diagnosis not present

## 2022-07-30 DIAGNOSIS — E785 Hyperlipidemia, unspecified: Secondary | ICD-10-CM | POA: Diagnosis not present

## 2022-07-30 DIAGNOSIS — Z1231 Encounter for screening mammogram for malignant neoplasm of breast: Secondary | ICD-10-CM

## 2022-07-30 DIAGNOSIS — M79674 Pain in right toe(s): Secondary | ICD-10-CM

## 2022-07-30 DIAGNOSIS — Z1211 Encounter for screening for malignant neoplasm of colon: Secondary | ICD-10-CM

## 2022-07-30 MED ORDER — HYDROCHLOROTHIAZIDE 12.5 MG PO TABS
12.5000 mg | ORAL_TABLET | Freq: Every day | ORAL | 3 refills | Status: DC
Start: 2022-07-30 — End: 2023-08-04

## 2022-07-30 NOTE — Assessment & Plan Note (Signed)
Chronic, stable.  Continue hydrochlorothiazide 12.5 mg daily

## 2022-07-30 NOTE — Patient Instructions (Signed)
Referral for colonoscopy  Let us know if you dont hear back within a week in regards to an appointment being scheduled.   So that you are aware, if you are [facility] MyChart user , please pay attention to your MyChart messages as you may receive a MyChart message with a phone number to call and schedule this test/appointment own your own from our referral coordinator. This is a new process so I do not want you to miss this message.  If you are not a MyChart user, you will receive a phone call.   Please call  and schedule your 3D mammogram and /or bone density scan as we discussed.   [facility]  ( new location in 2023)  [address], [place], [place] [address]  [place], [place]  [phone]

## 2022-07-30 NOTE — Progress Notes (Signed)
Assessment & Plan:  Primary hypertension Assessment & Plan: Chronic, stable.  Continue hydrochlorothiazide 12.5 mg daily  Orders: -     CBC with Differential/Platelet; Future -     Comprehensive metabolic panel; Future -     Hemoglobin A1c; Future -     Lipid panel; Future -     TSH; Future  Hyperlipidemia, unspecified hyperlipidemia type -     CBC with Differential/Platelet; Future -     Comprehensive metabolic panel; Future -     Hemoglobin A1c; Future -     Lipid panel; Future -     TSH; Future  Prediabetes -     Hemoglobin A1c; Future  Essential hypertension -     hydroCHLOROthiazide; Take 1 tablet (12.5 mg total) by mouth daily.  Dispense: 90 tablet; Refill: 3 -     TSH; Future  History of vitamin D deficiency -     VITAMIN D 25 Hydroxy (Vit-D Deficiency, Fractures); Future  Pain of toe of right foot -     Ambulatory referral to Podiatry  Screen for colon cancer -     Ambulatory referral to Gastroenterology  Encounter for screening mammogram for malignant neoplasm of breast -     3D Screening Mammogram, Left and Right; Future     Return precautions given.   Risks, benefits, and alternatives of the medications and treatment plan prescribed today were discussed, and patient expressed understanding.   Education regarding symptom management and diagnosis given to patient on AVS either electronically or printed.  Return in about 1 year (around 07/30/2023) for Complete Physical Exam, Fasting labs in 2-3 weeks.  Crystal Plowman, FNP  Subjective:    Patient ID: Crystal Leonard, female    DOB: 09/15/1970, 52 y.o.   MRN: [id number]  CC: Crystal Leonard is a 52 y.o. female who presents today for follow up.   HPI: Overall feels well today.    Right great toe nail in grown.  No purulent discharge.  Tender when she walks   She continues to follow with Dr Maryruth Bun   She continues follows with Dr Jean Rosenthal, OB GYN.    Allergies: Tetanus toxoids Current Outpatient  Medications on File Prior to Visit  Medication Sig Dispense Refill   albuterol (PROVENTIL HFA) 108 (90 Base) MCG/ACT inhaler Inhale 2 puffs into the lungs every 6 (six) hours as needed for wheezing or shortness of breath. 18 g 2   citalopram (CELEXA) 40 MG tablet Take 40 mg by mouth in the morning.     clonazePAM (KLONOPIN) 0.5 MG tablet Take 0.5 mg by mouth daily as needed for anxiety.     DULoxetine (CYMBALTA) 60 MG capsule Take 60 mg by mouth in the morning.     MAGNESIUM PO Take 1 capsule by mouth daily.     Multiple Vitamin (MULTIVITAMIN WITH MINERALS) TABS tablet Take 1 tablet by mouth in the morning.     omeprazole (PRILOSEC) 20 MG capsule Take 20 mg by mouth daily as needed (acid reflux/indigestion.).     estradiol (CLIMARA - DOSED IN MG/24 HR) 0.05 mg/24hr patch Place 0.05 mg onto the skin in the morning and at bedtime. (Patient not taking: Reported on 07/30/2022)     No current facility-administered medications on file prior to visit.    Review of Systems  Constitutional:  Negative for chills and fever.  Respiratory:  Negative for cough.   Cardiovascular:  Negative for chest pain and palpitations.  Gastrointestinal:  Negative for nausea and  vomiting.      Objective:    BP 126/80 (BP Location: Right Arm, Patient Position: Sitting, Cuff Size: Large)   Pulse 91   Temp 98.3 F (36.8 C) (Oral)   Ht 5\' 6"  (1.676 m)   Wt 240 lb 2 oz (108.9 kg)   SpO2 94%   BMI 38.76 kg/m  BP Readings from Last 3 Encounters:  07/30/22 126/80  05/03/21 137/80  03/04/21 116/83   Wt Readings from Last 3 Encounters:  07/30/22 240 lb 2 oz (108.9 kg)  05/03/21 250 lb (113.4 kg)  03/02/21 248 lb (112.5 kg)    Physical Exam Vitals reviewed.  Constitutional:      Appearance: She is well-developed.  Eyes:     Conjunctiva/sclera: Conjunctivae normal.  Cardiovascular:     Rate and Rhythm: Normal rate and regular rhythm.     Pulses: Normal pulses.     Heart sounds: Normal heart sounds.   Pulmonary:     Effort: Pulmonary effort is normal.     Breath sounds: Normal breath sounds. No wheezing, rhonchi or rales.  Musculoskeletal:       Feet:  Feet:     Comments: No erythema, edema, purulent discharge or increased heat around right great toe nailbed. Skin:    General: Skin is warm and dry.  Neurological:     Mental Status: She is alert.  Psychiatric:        Speech: Speech normal.        Behavior: Behavior normal.        Thought Content: Thought content normal.

## 2022-08-05 DIAGNOSIS — S93602A Unspecified sprain of left foot, initial encounter: Secondary | ICD-10-CM | POA: Insufficient documentation

## 2022-08-05 DIAGNOSIS — S93402A Sprain of unspecified ligament of left ankle, initial encounter: Secondary | ICD-10-CM | POA: Insufficient documentation

## 2022-08-05 DIAGNOSIS — M47816 Spondylosis without myelopathy or radiculopathy, lumbar region: Secondary | ICD-10-CM | POA: Insufficient documentation

## 2022-08-05 DIAGNOSIS — M79672 Pain in left foot: Secondary | ICD-10-CM | POA: Insufficient documentation

## 2022-08-15 ENCOUNTER — Ambulatory Visit: Payer: BC Managed Care – PPO | Admitting: Podiatry

## 2022-08-20 IMAGING — CT CT ABD-PELV W/ CM
2 of 5 series · 16 of 46 positions shown, 18 images · IV contrast (APPLIED)
Comparison: February 27, 2021.

CLINICAL DATA: Acute left lower quadrant abdominal pain.

EXAM:
CT ABDOMEN AND PELVIS WITH CONTRAST
TECHNIQUE: Multidetector CT imaging of the abdomen and pelvis was performed
using the standard protocol following bolus administration of
intravenous contrast.
CONTRAST:  100mL OMNIPAQUE IOHEXOL 300 MG/ML  SOLN

[Series 2: routine abd/pel with · axial · 0.88mm/px · z∈[-1087,-607]mm · 13 of 108 slices shown, 15 images]
[im 6/108  soft-tissue]
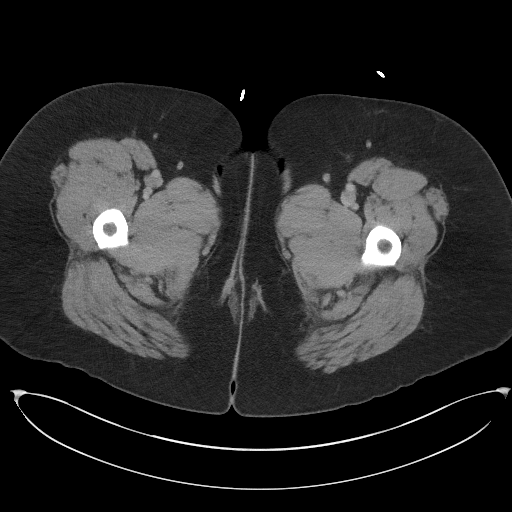
[im 6/108  bone]
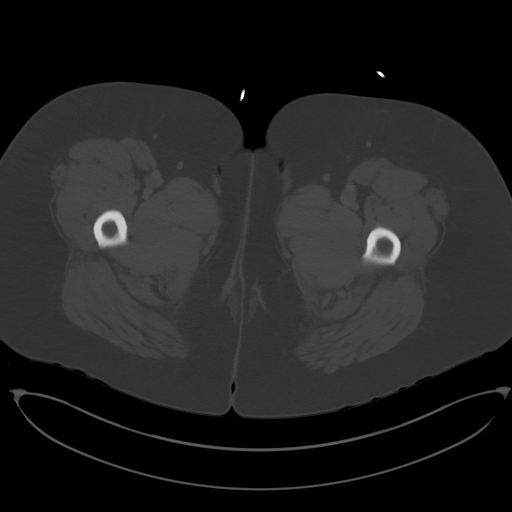
[im 12/108  soft-tissue]
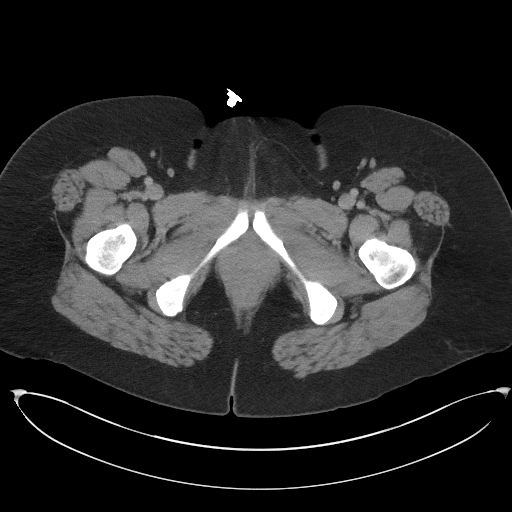
[im 24/108  soft-tissue]
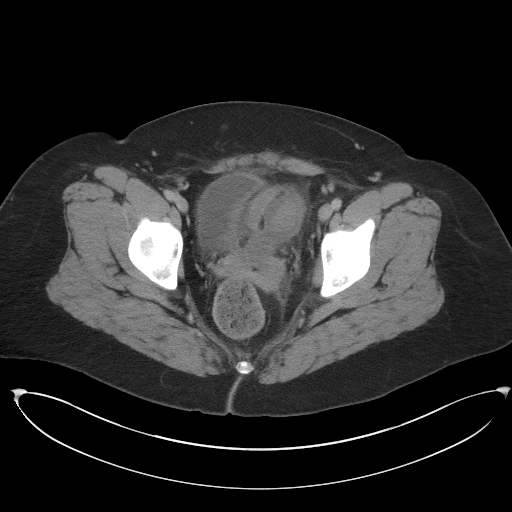
[im 30/108  soft-tissue]
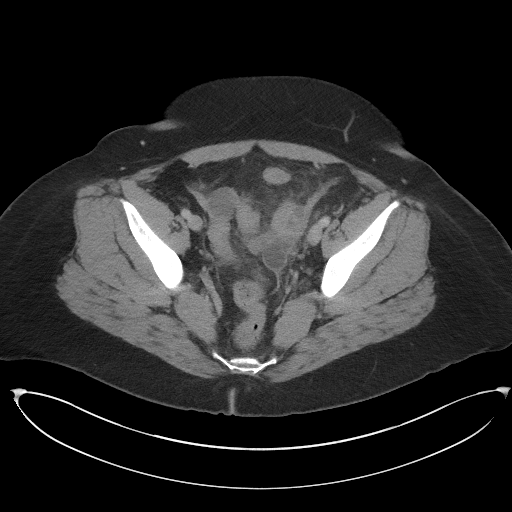
[im 36/108  soft-tissue]
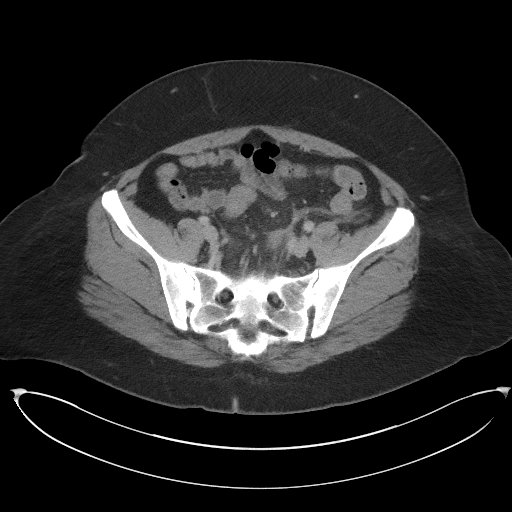
[im 48/108  soft-tissue]
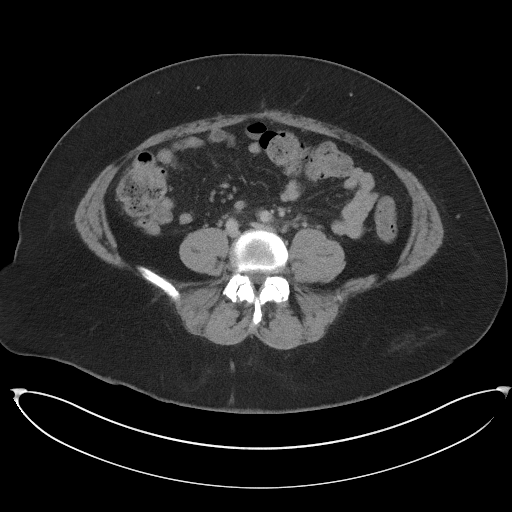
[im 54/108  soft-tissue]
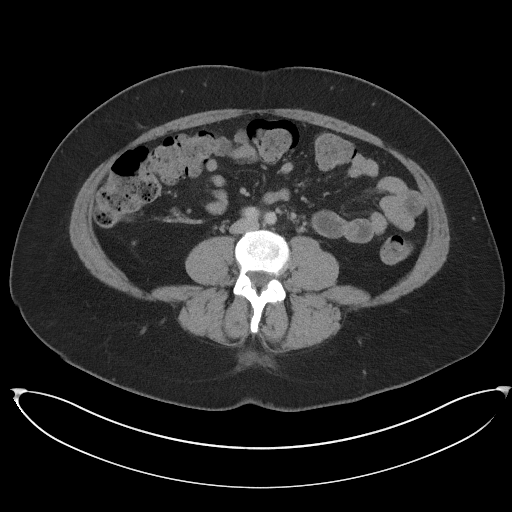
[im 60/108  soft-tissue]
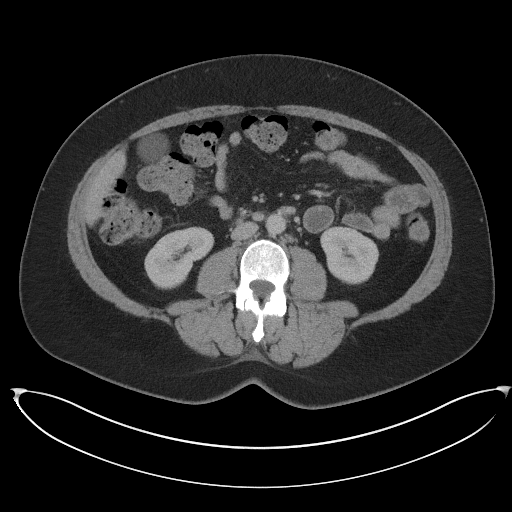
[im 72/108  soft-tissue]
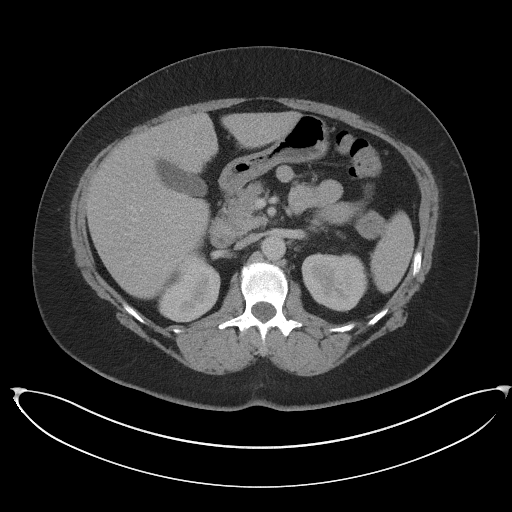
[im 72/108  bone]
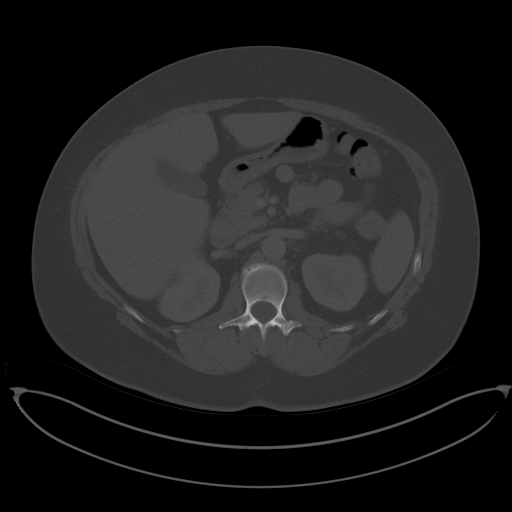
[im 78/108  soft-tissue]
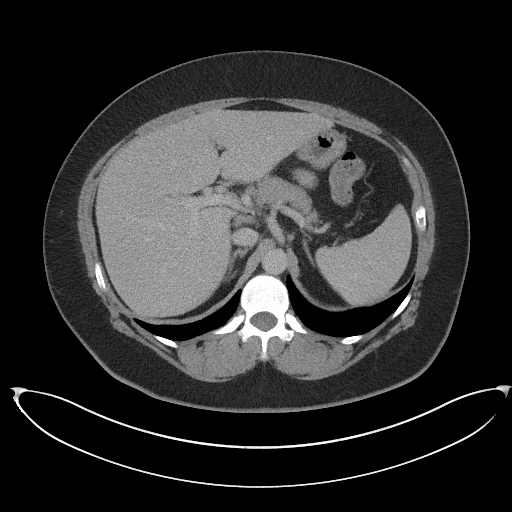
[im 84/108  soft-tissue]
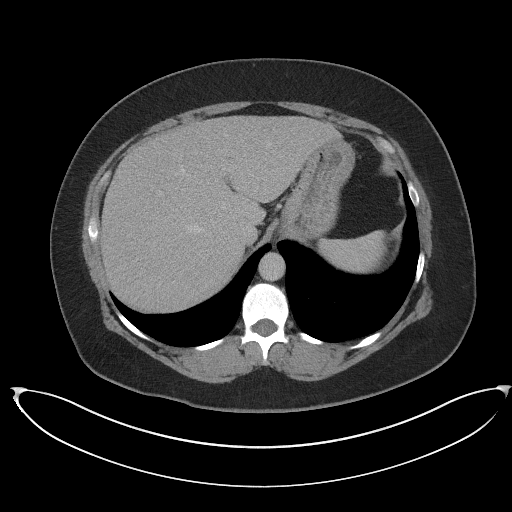
[im 96/108  soft-tissue]
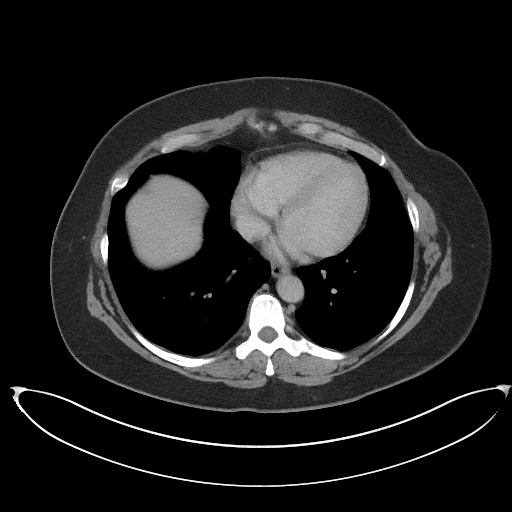
[im 102/108  soft-tissue]
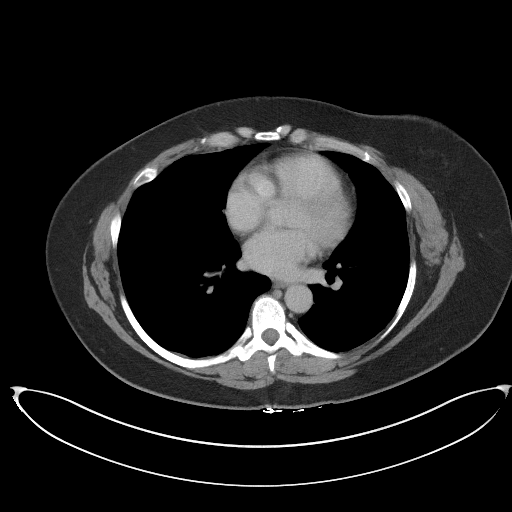

[Series 5: coronal st · coronal · 0.93mm/px · 3 of 110 slices shown]
[im 37/110  soft-tissue]
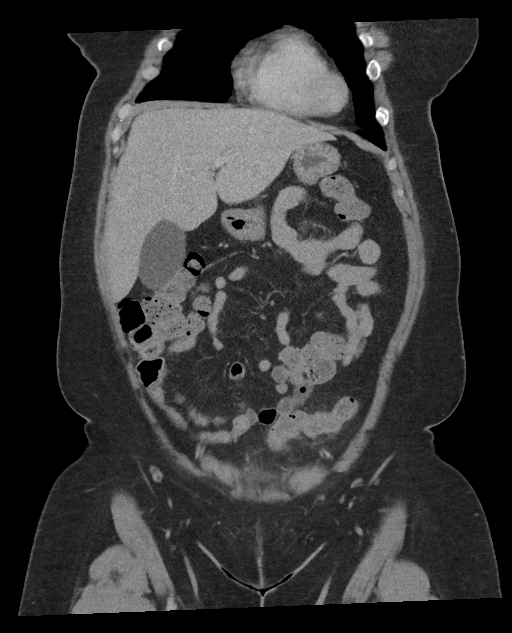
[im 49/110  soft-tissue]
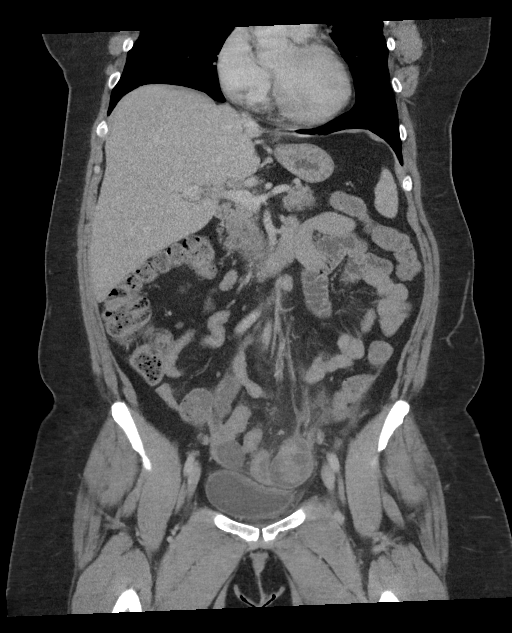
[im 61/110  soft-tissue]
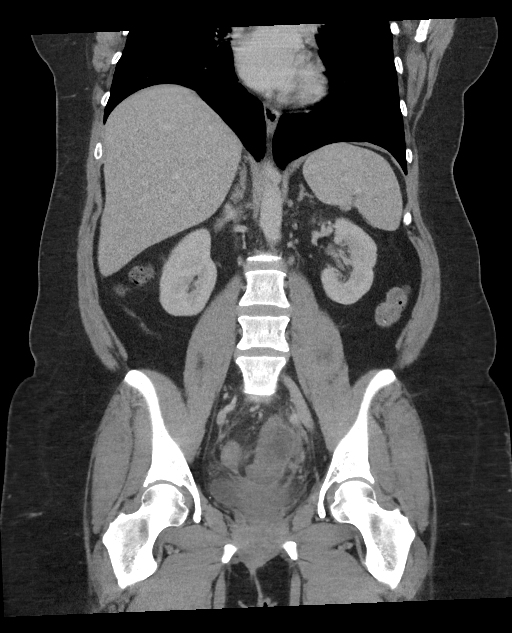

[16 of 46 positions shown; findings below may reference images not displayed]

FINDINGS: Lower chest: No acute abnormality.

Hepatobiliary: No focal liver abnormality is seen. No gallstones,
gallbladder wall thickening, or biliary dilatation.

Pancreas: Unremarkable. No pancreatic ductal dilatation or
surrounding inflammatory changes.

Spleen: Normal in size without focal abnormality.

Adrenals/Urinary Tract: Adrenal glands are unremarkable. Kidneys are
normal, without renal calculi, focal lesion, or hydronephrosis.
Bladder is unremarkable.

Stomach/Bowel: The stomach appears normal. The appendix appears
normal. There is no evidence of bowel obstruction. There appears to
be wall thickening involving the proximal sigmoid colon which may
represent inflammation secondary to left adnexal process.

Vascular/Lymphatic: No significant vascular findings are present. No
enlarged abdominal or pelvic lymph nodes.

Reproductive: Status post hysterectomy. 3.2 cm right ovarian cyst is
noted. There is noted the interval development of severe
inflammation involving the left adnexal region and ovary.
Serpiginous tubular structure is noted concerning for hydrosalpinx
or pyosalpinx.

Other: No abdominal wall hernia or abnormality. No abdominopelvic
ascites.

Musculoskeletal: No acute or significant osseous findings.
IMPRESSION: There is interval development of severe inflammatory change
involving the left adnexal region and ovary; this is concerning for
pelvic inflammatory disease and potentially pyosalpinx. Pelvic
ultrasound is recommended for further evaluation. Status post
hysterectomy. There may be secondary inflammation of the adjacent
sigmoid colon. These results will be called to the ordering
clinician or representative by the Radiologist Assistant, and
communication documented in the PACS or zVision Dashboard.

## 2022-09-03 ENCOUNTER — Ambulatory Visit: Payer: BC Managed Care – PPO | Admitting: Podiatry

## 2022-09-03 DIAGNOSIS — L600 Ingrowing nail: Secondary | ICD-10-CM

## 2022-09-03 DIAGNOSIS — N739 Female pelvic inflammatory disease, unspecified: Secondary | ICD-10-CM | POA: Insufficient documentation

## 2022-09-03 NOTE — Progress Notes (Signed)
Subjective:  Patient ID: Crystal Leonard, female    DOB: Nov 30, 1970,  MRN: [id number]  Chief Complaint  Patient presents with   Nail Problem    Right hallux nail causing discomfort     52 y.o. female presents with the above complaint.  Patient presents with right hallux medial border ingrown painful to touch is progressive gotten worse worse with ambulation worse with pressure.  She states that she would like to have removed pain scale is 5 out of 10 dull achy in nature.  She tried to take it out for self which has not helped   Review of Systems: Negative except as noted in the HPI. Denies N/V/F/Ch.  Past Medical History:  Diagnosis Date   Asthma    Chicken pox    Depression    GERD (gastroesophageal reflux disease)    Headache    Frequent headaches   History of kidney stones    Hypertension    Migraine    UTI (lower urinary tract infection)     Current Outpatient Medications:    meloxicam (MOBIC) 15 MG tablet, Take 1 tablet every day by oral route with meal(s)., Disp: , Rfl:    albuterol (PROVENTIL HFA) 108 (90 Base) MCG/ACT inhaler, Inhale 2 puffs into the lungs every 6 (six) hours as needed for wheezing or shortness of breath., Disp: 18 g, Rfl: 2   citalopram (CELEXA) 40 MG tablet, Take 40 mg by mouth in the morning., Disp: , Rfl:    clonazePAM (KLONOPIN) 0.5 MG tablet, Take 0.5 mg by mouth daily as needed for anxiety., Disp: , Rfl:    cyanocobalamin (VITAMIN B12) 1000 MCG tablet, Take by mouth., Disp: , Rfl:    DULoxetine (CYMBALTA) 60 MG capsule, Take 60 mg by mouth in the morning., Disp: , Rfl:    estradiol (CLIMARA - DOSED IN MG/24 HR) 0.05 mg/24hr patch, Place 0.05 mg onto the skin in the morning and at bedtime. (Patient not taking: Reported on 07/30/2022), Disp: , Rfl:    hydrochlorothiazide (HYDRODIURIL) 12.5 MG tablet, Take 1 tablet (12.5 mg total) by mouth daily., Disp: 90 tablet, Rfl: 3   hydrOXYzine (VISTARIL) 25 MG capsule, TAKE 1 CAPSULE BY MOUTH TWICE A DAY AS  NEEDED, Disp: , Rfl:    MAGNESIUM PO, Take 1 capsule by mouth daily., Disp: , Rfl:    Multiple Vitamin (MULTI-VITAMIN) tablet, Take 1 tablet by mouth daily., Disp: , Rfl:    Multiple Vitamin (MULTIVITAMIN WITH MINERALS) TABS tablet, Take 1 tablet by mouth in the morning., Disp: , Rfl:    omeprazole (PRILOSEC) 20 MG capsule, Take 20 mg by mouth daily as needed (acid reflux/indigestion.)., Disp: , Rfl:    oxyCODONE-acetaminophen (PERCOCET/ROXICET) 5-325 MG tablet, TAKE 1 TO 2 TABLETS BY MOUTH EVERY 4 TO 6 HOURS AS NEEDED, Disp: , Rfl:    predniSONE (DELTASONE) 10 MG tablet, TAKE 6 TABLETS BY MOUTH TODAY, THEN DECREASE BY ONE TABLET DAILY UNTIL GONE (60,50,40,30,20,10), Disp: , Rfl:    sulfamethoxazole-trimethoprim (BACTRIM DS) 800-160 MG tablet, Take 1 tablet by mouth 2 (two) times daily., Disp: , Rfl:    traMADol (ULTRAM) 50 MG tablet, Take 1 tablet by mouth every 12 (twelve) hours as needed., Disp: , Rfl:    valACYclovir (VALTREX) 1000 MG tablet, TAKE ONE TABLET BY MOUTH THREE TIMES DAILY FOR 7 DAYS, Disp: , Rfl:   Social History   Tobacco Use  Smoking Status Some Days   Packs/day: 0.25   Years: 20.00   Additional pack years:  0.00   Total pack years: 5.00   Types: Cigarettes  Smokeless Tobacco Never  Tobacco Comments   0.5 pack per week- 01/25/2020    Allergies  Allergen Reactions   Tetanus Toxoids Nausea And Vomiting and Other (See Comments)    Syncope    Objective:  There were no vitals filed for this visit. There is no height or weight on file to calculate BMI. Constitutional Well developed. Well nourished.  Vascular Dorsalis pedis pulses palpable bilaterally. Posterior tibial pulses palpable bilaterally. Capillary refill normal to all digits.  No cyanosis or clubbing noted. Pedal hair growth normal.  Neurologic Normal speech. Oriented to person, place, and time. Epicritic sensation to light touch grossly present bilaterally.  Dermatologic Painful ingrowing nail at  medial nail borders of the hallux nail right. No other open wounds. No skin lesions.  Orthopedic: Normal joint ROM without pain or crepitus bilaterally. No visible deformities. No bony tenderness.   Radiographs: None Assessment:   1. Ingrown toenail of right foot    Plan:  Patient was evaluated and treated and all questions answered.  Ingrown Nail, right -Patient elects to proceed with minor surgery to remove ingrown toenail removal today. Consent reviewed and signed by patient. -Ingrown nail excised. See procedure note. -Educated on post-procedure care including soaking. Written instructions provided and reviewed. -Patient to follow up in 2 weeks for nail check.  Procedure: Excision of Ingrown Toenail Location: Right 1st toe medial nail borders. Anesthesia: Lidocaine 1% plain; 1.5 mL and Marcaine 0.5% plain; 1.5 mL, digital block. Skin Prep: Betadine. Dressing: Silvadene; telfa; dry, sterile, compression dressing. Technique: Following skin prep, the toe was exsanguinated and a tourniquet was secured at the base of the toe. The affected nail border was freed, split with a nail splitter, and excised. Chemical matrixectomy was then performed with phenol and irrigated out with alcohol. The tourniquet was then removed and sterile dressing applied. Disposition: Patient tolerated procedure well. Patient to return in 2 weeks for follow-up.   No follow-ups on file.

## 2022-12-06 ENCOUNTER — Other Ambulatory Visit: Payer: Self-pay | Admitting: Family

## 2022-12-06 DIAGNOSIS — Z1212 Encounter for screening for malignant neoplasm of rectum: Secondary | ICD-10-CM

## 2022-12-06 DIAGNOSIS — Z1211 Encounter for screening for malignant neoplasm of colon: Secondary | ICD-10-CM

## 2023-02-06 LAB — COLOGUARD: COLOGUARD: NEGATIVE

## 2023-02-14 ENCOUNTER — Telehealth: Payer: Self-pay

## 2023-02-14 NOTE — Telephone Encounter (Signed)
Tilia,  Cologuard NEGATIVE which indicates lower likelihood of cancer or pre cancer. You will need to repeat test in 3 years or sooner if any symptoms including blood in stool, change in bowel habits.   Regards, Margare

## 2023-02-14 NOTE — Telephone Encounter (Signed)
-----   Message from Rennie Plowman sent at 02/14/2023 11:56 AM EST ----- Call patient Patient has not viewed MyChart result note.  Please review my chart note in detail with patient.   Please let me know if questions

## 2023-08-03 ENCOUNTER — Other Ambulatory Visit: Payer: Self-pay | Admitting: Family

## 2023-08-03 DIAGNOSIS — I10 Essential (primary) hypertension: Secondary | ICD-10-CM

## 2023-08-04 ENCOUNTER — Encounter: Payer: BC Managed Care – PPO | Admitting: Family

## 2023-11-17 ENCOUNTER — Ambulatory Visit: Admitting: Family

## 2023-11-17 VITALS — BP 124/78 | HR 88 | Temp 97.8°F | Ht 66.0 in | Wt 255.2 lb

## 2023-11-17 DIAGNOSIS — Z0000 Encounter for general adult medical examination without abnormal findings: Secondary | ICD-10-CM

## 2023-11-17 DIAGNOSIS — J9801 Acute bronchospasm: Secondary | ICD-10-CM | POA: Diagnosis not present

## 2023-11-17 DIAGNOSIS — Z1231 Encounter for screening mammogram for malignant neoplasm of breast: Secondary | ICD-10-CM

## 2023-11-17 DIAGNOSIS — R059 Cough, unspecified: Secondary | ICD-10-CM | POA: Diagnosis not present

## 2023-11-17 DIAGNOSIS — I10 Essential (primary) hypertension: Secondary | ICD-10-CM

## 2023-11-17 LAB — CBC WITH DIFFERENTIAL/PLATELET
Basophils Absolute: 0 K/uL (ref 0.0–0.1)
Basophils Relative: 0.5 % (ref 0.0–3.0)
Eosinophils Absolute: 0.2 K/uL (ref 0.0–0.7)
Eosinophils Relative: 2 % (ref 0.0–5.0)
HCT: 45.5 % (ref 36.0–46.0)
Hemoglobin: 15.2 g/dL — ABNORMAL HIGH (ref 12.0–15.0)
Lymphocytes Relative: 36.5 % (ref 12.0–46.0)
Lymphs Abs: 3.3 K/uL (ref 0.7–4.0)
MCHC: 33.3 g/dL (ref 30.0–36.0)
MCV: 88.9 fl (ref 78.0–100.0)
Monocytes Absolute: 0.6 K/uL (ref 0.1–1.0)
Monocytes Relative: 6 % (ref 3.0–12.0)
Neutro Abs: 5 K/uL (ref 1.4–7.7)
Neutrophils Relative %: 55 % (ref 43.0–77.0)
Platelets: 367 K/uL (ref 150.0–400.0)
RBC: 5.12 Mil/uL — ABNORMAL HIGH (ref 3.87–5.11)
RDW: 14.2 % (ref 11.5–15.5)
WBC: 9.1 K/uL (ref 4.0–10.5)

## 2023-11-17 LAB — COMPREHENSIVE METABOLIC PANEL WITH GFR
ALT: 14 U/L (ref 0–35)
AST: 15 U/L (ref 0–37)
Albumin: 4.4 g/dL (ref 3.5–5.2)
Alkaline Phosphatase: 60 U/L (ref 39–117)
BUN: 20 mg/dL (ref 6–23)
CO2: 30 meq/L (ref 19–32)
Calcium: 9.9 mg/dL (ref 8.4–10.5)
Chloride: 102 meq/L (ref 96–112)
Creatinine, Ser: 0.86 mg/dL (ref 0.40–1.20)
GFR: 78.03 mL/min (ref >60.00)
Glucose, Bld: 103 mg/dL — ABNORMAL HIGH (ref 70–99)
Potassium: 4.2 meq/L (ref 3.5–5.1)
Sodium: 141 meq/L (ref 135–145)
Total Bilirubin: 0.4 mg/dL (ref 0.2–1.2)
Total Protein: 7.1 g/dL (ref 6.0–8.3)

## 2023-11-17 LAB — LIPID PANEL
Cholesterol: 215 mg/dL — ABNORMAL HIGH (ref 0–200)
HDL: 48.4 mg/dL (ref >39.00)
LDL Cholesterol: 126 mg/dL — ABNORMAL HIGH (ref 0–99)
NonHDL: 166.65
Total CHOL/HDL Ratio: 4
Triglycerides: 205 mg/dL — ABNORMAL HIGH (ref 0.0–149.0)
VLDL: 41 mg/dL — ABNORMAL HIGH (ref 0.0–40.0)

## 2023-11-17 LAB — TSH: TSH: 1.87 u[IU]/mL (ref 0.35–5.50)

## 2023-11-17 LAB — HEMOGLOBIN A1C: Hgb A1c MFr Bld: 6.7 % — ABNORMAL HIGH (ref 4.6–6.5)

## 2023-11-17 LAB — VITAMIN D 25 HYDROXY (VIT D DEFICIENCY, FRACTURES): VITD: 41.77 ng/mL (ref 30.00–100.00)

## 2023-11-17 MED ORDER — ALBUTEROL SULFATE HFA 108 (90 BASE) MCG/ACT IN AERS: 2.0000 | INHALATION_SPRAY | Freq: Four times a day (QID) | RESPIRATORY_TRACT | 2 refills | Status: AC | PRN

## 2023-11-17 NOTE — Progress Notes (Signed)
 Assessment & Plan:  Annual physical exam Assessment & Plan: Clinical breast exam performed today. Cologuard is up-to-date.  Patient will schedule mammogram.  Counseled on creating caloric deficit, variation and exercise.  Referral to CT lung cancer screening program.  Orders: -     VITAMIN D  25 Hydroxy (Vit-D Deficiency, Fractures) -     Hemoglobin A1c -     CBC with Differential/Platelet -     Comprehensive metabolic panel with GFR -     Lipid panel -     TSH -     Ambulatory Referral for Lung Cancer Scre -     3D Screening Mammogram, Left and Right; Future  Encounter for screening mammogram for malignant neoplasm of breast  Cough  Bronchospasm -     Albuterol  Sulfate HFA; Inhale 2 puffs into the lungs every 6 (six) hours as needed for wheezing or shortness of breath.  Dispense: 18 g; Refill: 2  Primary hypertension Assessment & Plan: Chronic, stable.  Continue hydrochlorothiazide  12.5 mg daily      Return precautions given.   Risks, benefits, and alternatives of the medications and treatment plan prescribed today were discussed, and patient expressed understanding.   Education regarding symptom management and diagnosis given to patient on AVS either electronically or printed.  No follow-ups on file.  Rollene Northern, FNP  Subjective:    Patient ID: Roxie CHRISTELLA Baller, female    DOB: Jul 03, 1970, 53 y.o.   MRN: [id number]  CC: PARNIKA TWETEN is a 53 y.o. female who presents today for physical exam.    HPI: She is frustrated by weight.  She has reduced carbs.  She walks regularly with her dogs.     Colorectal Cancer Screening: due; reported normal 15 years ago; cologuard neg 01/2023  Breast Cancer Screening: Mammogram due,  last done 12/2020.  Cervical Cancer Screening: hysterectomy due to pelvic pain, endometriosis. No h/o GYN cancer. She had one abnormal pap smear in 20's. NO CERVIX on exam 01/24/21   Bone Health screening/DEXA for 65+: No increased fracture  risk. Defer screening at this time.  Lung Cancer Screening: meets criteria  No family history of AAA.        Tetanus - due; declines        Pneumococcal - Candidate for. Exercise: Gets regular exercise walking  5 days per week Alcohol use:  occassional Smoking/tobacco use: smoker.    Health Maintenance  Topic Date Due   Hepatitis B Vaccine (1 of 3 - 19+ 3-dose series) Never done   COVID-19 Vaccine (4 - 2025-26 season) 11/03/2023   Breast Cancer Screening  11/17/2023   Zoster (Shingles) Vaccine (1 of 2) 02/16/2024*   Flu Shot  06/01/2024*   Pneumococcal Vaccine for age over 75 (1 of 2 - PCV) 11/16/2024*   Pap with HPV screening  01/24/2026   Cologuard (Stool DNA test)  01/30/2026   HIV Screening  Completed   HPV Vaccine  Aged Out   Meningitis B Vaccine  Aged Out   DTaP/Tdap/Td vaccine  Discontinued   Colon Cancer Screening  Discontinued   Hepatitis C Screening  Discontinued  *Topic was postponed. The date shown is not the original due date.    ALLERGIES: Tetanus toxoid-containing vaccines  Current Outpatient Medications on File Prior to Visit  Medication Sig Dispense Refill   citalopram (CELEXA) 40 MG tablet Take 40 mg by mouth in the morning.     clonazePAM (KLONOPIN) 0.5 MG tablet Take 0.5 mg by mouth daily as  needed for anxiety.     DULoxetine (CYMBALTA) 60 MG capsule Take 60 mg by mouth in the morning.     hydrochlorothiazide  (HYDRODIURIL ) 12.5 MG tablet TAKE 1 TABLET BY MOUTH EVERY DAY 90 tablet 3   Multiple Vitamin (MULTI-VITAMIN) tablet Take 1 tablet by mouth daily.     omeprazole (PRILOSEC) 20 MG capsule Take 20 mg by mouth daily as needed (acid reflux/indigestion.).     estradiol (CLIMARA - DOSED IN MG/24 HR) 0.05 mg/24hr patch Place 0.05 mg onto the skin in the morning and at bedtime. (Patient not taking: Reported on 11/17/2023)     No current facility-administered medications on file prior to visit.    Review of Systems  Constitutional:  Negative for chills,  fever and unexpected weight change.  HENT:  Negative for congestion.   Respiratory:  Negative for cough.   Cardiovascular:  Negative for chest pain, palpitations and leg swelling.  Gastrointestinal:  Negative for nausea and vomiting.  Musculoskeletal:  Negative for arthralgias and myalgias.  Skin:  Negative for rash.  Neurological:  Negative for headaches.  Hematological:  Negative for adenopathy.  Psychiatric/Behavioral:  Negative for confusion.       Objective:    BP 124/78   Pulse 88   Temp 97.8 F (36.6 C) (Oral)   Ht 5' 6 (1.676 m)   Wt 255 lb 3.2 oz (115.8 kg)   SpO2 96%   BMI 41.19 kg/m   BP Readings from Last 3 Encounters:  11/17/23 124/78  07/30/22 126/80  05/03/21 137/80   Wt Readings from Last 3 Encounters:  11/17/23 255 lb 3.2 oz (115.8 kg)  07/30/22 240 lb 2 oz (108.9 kg)  05/03/21 250 lb (113.4 kg)    Physical Exam Vitals reviewed.  Constitutional:      Appearance: Normal appearance. She is well-developed.  Eyes:     Conjunctiva/sclera: Conjunctivae normal.  Neck:     Thyroid: No thyroid mass or thyromegaly.  Cardiovascular:     Rate and Rhythm: Normal rate and regular rhythm.     Pulses: Normal pulses.     Heart sounds: Normal heart sounds.  Pulmonary:     Effort: Pulmonary effort is normal.     Breath sounds: Normal breath sounds. No wheezing, rhonchi or rales.  Chest:  Breasts:    Breasts are symmetrical.     Right: No inverted nipple, mass, nipple discharge, skin change or tenderness.     Left: No inverted nipple, mass, nipple discharge, skin change or tenderness.  Abdominal:     General: Bowel sounds are normal. There is no distension.     Palpations: Abdomen is soft. Abdomen is not rigid. There is no fluid wave or mass.     Tenderness: There is no abdominal tenderness. There is no guarding or rebound.  Lymphadenopathy:     Head:     Right side of head: No submental, submandibular, tonsillar, preauricular, posterior auricular or  occipital adenopathy.     Left side of head: No submental, submandibular, tonsillar, preauricular, posterior auricular or occipital adenopathy.     Cervical: No cervical adenopathy.     Right cervical: No superficial, deep or posterior cervical adenopathy.    Left cervical: No superficial, deep or posterior cervical adenopathy.  Skin:    General: Skin is warm and dry.  Neurological:     Mental Status: She is alert.  Psychiatric:        Speech: Speech normal.        Behavior: Behavior  normal.        Thought Content: Thought content normal.

## 2023-11-17 NOTE — Patient Instructions (Signed)
 Please call  and schedule your 3D mammogram and /or bone density scan as we discussed.   [facility]  ( new location in 2023)  [address], [place], [place] [address]  [place], [place]  [phone]   You are due for annual lung cancer screening program.      I have placed a referral to [facility] pulmonology  and their office will reach out to you to schedule your CT of your chest.  They will reach out to you annually going forward.  If you do not hear from their office in the next 1 to 2 weeks, please call [facility] at [phone]   So let me know if there are any issues in getting scheduled.    Please download Myfitness Pal App ( basic version is free).   You may log every thing you eat for even 2-3 days to get a better of idea of total daily calories. To loose weight, we have to create caloric deficit to loose weight. The goal is 1-2 lbs per week of weight loss.   Excellent article below from [facility].   https://[email or web address].it  Calorie counting made easy  Eat less, exercise more. If only it were that simple! As most dieters know, losing weight can be very challenging. As this report details, a range of influences can affect how people gain and lose weight. But a basic understanding of how to tip your energy balance in favor of weight loss is a good place to start.  Start by determining how many calories you should consume each day. To do so, you need to know how many calories you need to maintain your current weight. Doing this requires a few simple calculations.  First, multiply your current weight by 15 -- that's roughly the number of calories per pound of body weight needed to maintain your current weight if you are moderately active. Moderately active means getting at least 30 minutes of physical activity a day in the form of exercise (walking at a brisk pace, climbing  stairs, or active gardening). Let's say you're a woman who is 5 feet, 4 inches tall and weighs 155 pounds, and you need to lose about 15 pounds to put you in a healthy weight range. If you multiply 155 by 15, you will get 2,325, which is the number of calories per day that you need in order to maintain your current weight (weight-maintenance calories). To lose weight, you will need to get below that total.  For example, to lose 1 to 2 pounds a week -- a rate that experts consider safe -- your food consumption should provide 500 to 1,000 calories less than your total weight-maintenance calories. If you need 2,325 calories a day to maintain your current weight, reduce your daily calories to between 1,325 and 1,825. If you are sedentary, you will also need to build more activity into your day. In order to lose at least a pound a week, try to do at least 30 minutes of physical activity on most days, and reduce your daily calorie intake by at least 500 calories. However, calorie intake should not fall below 1,200 a day in women or 1,500 a day in men, except under the supervision of a health professional. Eating too few calories can endanger your health by depriving you of needed nutrients.  Meeting your calorie target How can you meet your daily calorie target? One approach is to add up the number  of calories per serving of all the foods that you eat, and then plan your menus accordingly. You can buy books that list calories per serving for many foods. In addition, the nutrition labels on all packaged foods and beverages provide calories per serving information. Make a point of reading the labels of the foods and drinks you use, noting the number of calories and the serving sizes. Many recipes published in cookbooks, newspapers, and magazines provide similar information.  If you hate counting calories, a different approach is to restrict how much and how often you eat, and to eat meals that are low in calories.  Dietary guidelines issued by the American Heart Association stress common sense in choosing your foods rather than focusing strictly on numbers, such as total calories or calories from fat. Whichever method you choose, research shows that a regular eating schedule -- with meals and snacks planned for certain times each day -- makes for the most successful approach. The same applies after you have lost weight and want to keep it off. Sticking with an eating schedule increases your chance of maintaining your new weight.    This is  Dr. Tullo's  ( an amazing physician in my office!)  example of a  Low GI  Diet:  It will allow you to lose 4 to 8  lbs  per month if you follow it carefully.  Your goal with exercise is a minimum of 30 minutes of aerobic exercise 5 days per week (Walking does not count once it becomes easy!)    All of the foods can be found at grocery stores and in bulk at [employer].  The Atkins protein bars and shakes are available in more varieties at [employer], [employer] and [employer].     7 AM Breakfast:  Choose from the following:  Low carbohydrate Protein  Shakes (I recommend the  Premier Protein chocolate shakes,  EAS AdvantEdge Carb Control shakes  Or the Atkins shakes all are under 3 net carbs)     a scrambled egg/bacon/cheese burrito made with Mission's carb balance whole wheat tortilla  (about 10 net carbs )  Medical laboratory scientific officer (basically a quiche without the pastry crust) that is eaten cold and very convenient way to get your eggs.  8 carbs)  If you make your own protein shakes, avoid bananas and pineapple,  And use low carb greek yogurt or original /unsweetened almond or soy milk    Avoid cereal and bananas, oatmeal and cream of wheat and grits. They are loaded with carbohydrates!   10 AM: high protein snack:  Protein bar by Atkins (the snack size, under 200 cal, usually < 6 net carbs).    A stick of cheese:  Around 1 carb,  100 cal     Dannon  Light n Fit [place] Yogurt  (80 cal, 8 carbs)  Other so called protein bars and Greek yogurts tend to be loaded with carbohydrates.  Remember, in food advertising, the word energy is synonymous for  carbohydrate.  Lunch:   A Sandwich using the bread choices listed, Can use any  Eggs,  lunchmeat, grilled meat or canned tuna), avocado, regular mayo/mustard  and cheese.  A Salad using blue cheese, ranch,  Goddess or vinagrette,  Avoid taco shells, croutons or confetti and no candied nuts but regular nuts OK.   No pretzels, nabs  or chips.  Pickles and miniature sweet peppers are a good low carb alternative that provide a crunch  The bread is the only source of carbohydrate in a sandwich and  can be decreased by trying some of the attached alternatives to traditional loaf bread   Avoid Low fat dressings, as well as [place] and [address] dressings They are loaded with sugar!   3 PM/ Mid day  Snack:  Consider  1 ounce of  almonds, walnuts, pistachios, pecans, peanuts,  Macadamia nuts or a nut medley.  Avoid granola and granola bars   Mixed nuts are ok in moderation as long as there are no raisins,  cranberries or dried fruit.   KIND bars are OK if you get the low glycemic index variety   Try the prosciutto/mozzarella cheese sticks by [employer]  In deli /backery section   High protein      6 PM  Dinner:     Meat/fowl/fish with a green salad, and either broccoli, cauliflower, green beans, spinach, brussel sprouts or  Lima beans. DO NOT BREAD THE PROTEIN!!      There is a low carb pasta by Dreamfield's that is acceptable and tastes great: only 5 digestible carbs/serving.( All grocery stores but BJs carry it ) Several ready made meals are available low carb:   Try Michel Angelo's chicken piccata or chicken or eggplant parm over low carb pasta.(Lowes and BJs)   [place] Carnitas (pulled pork, no sauce,  0 carbs) or his beef pot roast to make a dinner burrito (at  [employer])  Pesto over low carb pasta (bj's sells a good quality pesto in the center refrigerated section of the deli   Try satueeing  Glenna Blonder with mushroooms as a good side   [employer] makes a mashed cauliflower that tastes like mashed potatoes  Whole wheat pasta is still full of digestible carbs and  Not as low in glycemic index as Dreamfield's.   Brown rice is still rice,  So skip the rice and noodles if you eat [place] or [place] (or at least limit to 1/2 cup)  9 PM snack :   Breyer's low carb fudgsicle or  ice cream bar (Carb Smart line), or  Weight Watcher's ice cream bar , or another no sugar added ice cream;  a serving of fresh berries/cherries with whipped cream   Cheese or DANNON'S LlGHT N FIT GREEK YOGURT  8 ounces of [place] unsweetened almond/cococunut milk    Treat yourself to a parfait made with whipped cream blueberiies, walnuts and vanilla greek yogurt  Avoid bananas, pineapple, grapes  and watermelon on a regular basis because they are high in sugar.  THINK OF THEM AS DESSERT  Remember that snack Substitutions should be less than 10 NET carbs per serving and meals < 20 carbs. Remember to subtract fiber grams to get the net carbs.  Health Maintenance for Postmenopausal Women Menopause is a normal process in which your ability to get pregnant comes to an end. This process happens slowly over many months or years, usually between the ages of 30 and 41. Menopause is complete when you have missed your menstrual period for 12 months. It is important to talk with your health care provider about some of the most common conditions that affect women after menopause (postmenopausal women). These include heart disease, cancer, and bone loss (osteoporosis). Adopting a healthy lifestyle and getting preventive care can help to promote your health and wellness. The actions you take can also lower your chances of developing some of these common conditions. What are the signs and symptoms  of  menopause? During menopause, you may have the following symptoms: Hot flashes. These can be moderate or severe. Night sweats. Decrease in sex drive. Mood swings. Headaches. Tiredness (fatigue). Irritability. Memory problems. Problems falling asleep or staying asleep. Talk with your health care provider about treatment options for your symptoms. Do I need hormone replacement therapy? Hormone replacement therapy is effective in treating symptoms that are caused by menopause, such as hot flashes and night sweats. Hormone replacement carries certain risks, especially as you become older. If you are thinking about using estrogen or estrogen with progestin, discuss the benefits and risks with your health care provider. How can I reduce my risk for heart disease and stroke? The risk of heart disease, heart attack, and stroke increases as you age. One of the causes may be a change in the body's hormones during menopause. This can affect how your body uses dietary fats, triglycerides, and cholesterol. Heart attack and stroke are medical emergencies. There are many things that you can do to help prevent heart disease and stroke. Watch your blood pressure High blood pressure causes heart disease and increases the risk of stroke. This is more likely to develop in people who have high blood pressure readings or are overweight. Have your blood pressure checked: Every 3-5 years if you are 24-64 years of age. Every year if you are 76 years old or older. Eat a healthy diet  Eat a diet that includes plenty of vegetables, fruits, low-fat dairy products, and lean protein. Do not eat a lot of foods that are high in solid fats, added sugars, or sodium. Get regular exercise Get regular exercise. This is one of the most important things you can do for your health. Most adults should: Try to exercise for at least 150 minutes each week. The exercise should increase your heart rate and make you sweat  (moderate-intensity exercise). Try to do strengthening exercises at least twice each week. Do these in addition to the moderate-intensity exercise. Spend less time sitting. Even light physical activity can be beneficial. Other tips Work with your health care provider to achieve or maintain a healthy weight. Do not use any products that contain nicotine or tobacco. These products include cigarettes, chewing tobacco, and vaping devices, such as e-cigarettes. If you need help quitting, ask your health care provider. Know your numbers. Ask your health care provider to check your cholesterol and your blood sugar (glucose). Continue to have your blood tested as directed by your health care provider. Do I need screening for cancer? Depending on your health history and family history, you may need to have cancer screenings at different stages of your life. This may include screening for: Breast cancer. Cervical cancer. Lung cancer. Colorectal cancer. What is my risk for osteoporosis? After menopause, you may be at increased risk for osteoporosis. Osteoporosis is a condition in which bone destruction happens more quickly than new bone creation. To help prevent osteoporosis or the bone fractures that can happen because of osteoporosis, you may take the following actions: If you are 9-53 years old, get at least 1,000 mg of calcium  and at least 600 international units (IU) of vitamin D  per day. If you are older than age 72 but younger than age 35, get at least 1,200 mg of calcium  and at least 600 international units (IU) of vitamin D  per day. If you are older than age 58, get at least 1,200 mg of calcium  and at least 800 international units (IU) of vitamin D  per day. Smoking  and drinking excessive alcohol increase the risk of osteoporosis. Eat foods that are rich in calcium  and vitamin D , and do weight-bearing exercises several times each week as directed by your health care provider. How does menopause  affect my mental health? Depression may occur at any age, but it is more common as you become older. Common symptoms of depression include: Feeling depressed. Changes in sleep patterns. Changes in appetite or eating patterns. Feeling an overall lack of motivation or enjoyment of activities that you previously enjoyed. Frequent crying spells. Talk with your health care provider if you think that you are experiencing any of these symptoms. General instructions See your health care provider for regular wellness exams and vaccines. This may include: Scheduling regular health, dental, and eye exams. Getting and maintaining your vaccines. These include: Influenza vaccine. Get this vaccine each year before the flu season begins. Pneumonia vaccine. Shingles vaccine. Tetanus, diphtheria, and pertussis (Tdap) booster vaccine. Your health care provider may also recommend other immunizations. Tell your health care provider if you have ever been abused or do not feel safe at home. Summary Menopause is a normal process in which your ability to get pregnant comes to an end. This condition causes hot flashes, night sweats, decreased interest in sex, mood swings, headaches, or lack of sleep. Treatment for this condition may include hormone replacement therapy. Take actions to keep yourself healthy, including exercising regularly, eating a healthy diet, watching your weight, and checking your blood pressure and blood sugar levels. Get screened for cancer and depression. Make sure that you are up to date with all your vaccines. This information is not intended to replace advice given to you by your health care provider. Make sure you discuss any questions you have with your health care provider. Document Revised: 07/10/2020 Document Reviewed: 07/10/2020 Elsevier Patient Education  2024 [employer].

## 2023-11-17 NOTE — Assessment & Plan Note (Addendum)
 Clinical breast exam performed today. Cologuard is up-to-date.  Patient will schedule mammogram.  Counseled on creating caloric deficit, variation and exercise.  Referral to CT lung cancer screening program.

## 2023-11-17 NOTE — Assessment & Plan Note (Signed)
 Chronic, stable.  Continue hydrochlorothiazide 12.5 mg daily

## 2023-11-21 ENCOUNTER — Ambulatory Visit: Payer: Self-pay | Admitting: Family
